# Patient Record
Sex: Female | Born: 1970 | Race: White | Hispanic: No | Marital: Married | Smoking: Never smoker
Health system: Southern US, Community
[De-identification: ages and names within clinical notes are randomized; demographics above are authoritative.]

## PROBLEM LIST (undated history)

## (undated) DIAGNOSIS — Z8051 Family history of malignant neoplasm of kidney: Secondary | ICD-10-CM

## (undated) DIAGNOSIS — Z8042 Family history of malignant neoplasm of prostate: Secondary | ICD-10-CM

## (undated) DIAGNOSIS — C50919 Malignant neoplasm of unspecified site of unspecified female breast: Secondary | ICD-10-CM

## (undated) HISTORY — DX: Family history of malignant neoplasm of prostate: Z80.42

## (undated) HISTORY — PX: MASTECTOMY: SHX3

## (undated) HISTORY — PX: REDUCTION MAMMAPLASTY: SUR839

## (undated) HISTORY — DX: Family history of malignant neoplasm of kidney: Z80.51

---

## 2015-06-16 ENCOUNTER — Other Ambulatory Visit: Payer: Self-pay | Admitting: Oncology

## 2015-08-23 ENCOUNTER — Other Ambulatory Visit: Payer: Self-pay | Admitting: Oncology

## 2015-08-24 ENCOUNTER — Encounter: Payer: Self-pay | Admitting: Oncology

## 2015-08-24 ENCOUNTER — Telehealth: Payer: Self-pay | Admitting: Oncology

## 2015-08-24 NOTE — Telephone Encounter (Signed)
Left a vm to let the patient that her appointment time has changed from 1pm to 430pm with Dr. Jana Hakim on 8/28. Will send patient a letter.

## 2015-09-03 ENCOUNTER — Other Ambulatory Visit: Payer: Self-pay | Admitting: *Deleted

## 2015-09-03 DIAGNOSIS — Z853 Personal history of malignant neoplasm of breast: Secondary | ICD-10-CM

## 2015-09-06 NOTE — Progress Notes (Signed)
[place]  Telephone:([phone] Fax:(336) [phone]  Patient rescheduled 09/07/2015 visit   ID: Erika Foster DOB: 1970/10/16  MR#: [id number]  BZJ#:[id number]  Patient Care Team: Chesley Noon, MD as PCP - General (Family Medicine) PCP: Chesley Noon, MD Chauncey Cruel, MD GYN: SU:  OTHER MD:  CHIEF COMPLAINT:   CURRENT TREATMENT:    BREAST CANCER HISTORY: According to the prior records. The patient was diagnosed with right-sided breast cancer at the age of 30, in 2006. She had a lumpectomy and sentinel lymph node sampling for what proved to be an invasive ductal carcinoma, grade 2, estrogen and progesterone receptor positive, HER-2 not amplified. She had a micrometastases in one of the 2 sentinel lymph nodes. 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 This showed a 0.6 cm residual tumor but all 10 additional lymph nodes were clear. Adjuvantly she was treated with doxorubicin and cyclophosphamide 4 followed by weekly paclitaxel 8, completed January 2007. She then received adjuvant radiation. After that she was on Zoladex and tamoxifen for 5 years.  Note that she had immediate implant reconstruction in 2006, but this had to be removed because of a staph infection. She then had a latissimus flap which unfortunately also was compromised by staph infection.  After moving to [facility] in 2014 the more recent data showing additional benefit from 10 years of tamoxifen compared to 5 was discussed and she resumed tamoxifen for a projected additional 5 years in 2014.  INTERVAL HISTORY: Her case was also presented in the multidisciplinary breast cancer conference that same morning. At that time a preliminary plan was proposed:  REVIEW OF SYSTEMS:  PAST MEDICAL HISTORY: No past medical history on file.  PAST SURGICAL HISTORY: No past surgical history on file.  FAMILY  HISTORY No family history on file. The patient's father was diagnosed with prostate cancer at age 59. The patient's mother had renal cancer at age 2. There is no history of breast or ovarian colon or uterine cancers.  GYNECOLOGIC HISTORY:  No LMP recorded. Menarche age 64, first live birth age 46, the patient is Brave P3. Menstrual cycles resumed after chemotherapy, April 2012.  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   ADVANCED DIRECTIVES:    HEALTH MAINTENANCE: Social History  Substance Use Topics  . Smoking status: Not on file  . Smokeless tobacco: Not on file  . Alcohol use Not on file     Colonoscopy:  PAP:  Bone density:   Allergies not on file  No current outpatient prescriptions on file.   No current facility-administered medications for this visit.     OBJECTIVE: There were no vitals filed for this visit.   There is no height or weight on file to calculate BMI.     Ocular: Sclerae unicteric, pupils equal, round and reactive to light Ear-nose-throat: Oropharynx clear and moist Lymphatic: No cervical or supraclavicular adenopathy Lungs no rales or rhonchi, good excursion bilaterally Heart regular rate and rhythm, no murmur appreciated Abd soft, nontender, positive bowel sounds MSK no focal spinal tenderness, no joint edema Neuro: non-focal, well-oriented, appropriate affect Breasts:    LAB RESULTS:  CMP  No results found for: NA, K, CL, CO2, GLUCOSE, BUN, CREATININE, CALCIUM, PROT, ALBUMIN, AST, ALT, ALKPHOS, BILITOT, GFRNONAA, GFRAA  INo results found for: SPEP, UPEP  No results found for: WBC, NEUTROABS, HGB, HCT, MCV, PLT    Chemistry   No results found  for: NA, K, CL, CO2, BUN, CREATININE, GLU No results found for: CALCIUM, ALKPHOS, AST, ALT, BILITOT     No results found for: LABCA2  No components found for: LABCA125  No results for input(s): INR in the last 168 hours.  Urinalysis No results found for: COLORURINE, APPEARANCEUR, LABSPEC, PHURINE,  GLUCOSEU, HGBUR, BILIRUBINUR, KETONESUR, PROTEINUR, UROBILINOGEN, NITRITE, LEUKOCYTESUR   STUDIES: No results found.  ELIGIBLE FOR AVAILABLE RESEARCH PROTOCOL:   ASSESSMENT: 45 y.o.   PLAN: We spent the better part of today's hour-long appointment discussing the biology of breast cancer in general, and the specifics of the patient's tumor in particular.    The patient has a good understanding of the overall plan. She agrees with it. She knows the goal of treatment in her case is cure. She will call with any problems that may develop before her next visit here.  Chauncey Cruel, MD   09/06/2015 4:27 PM Medical Oncology and Hematology [facility] [address] [place], [place] [address] Tel. [phone]    Fax. [phone]

## 2015-09-07 ENCOUNTER — Other Ambulatory Visit: Payer: BLUE CROSS/BLUE SHIELD

## 2015-09-07 ENCOUNTER — Ambulatory Visit: Payer: BLUE CROSS/BLUE SHIELD | Admitting: Oncology

## 2015-09-07 DIAGNOSIS — C50911 Malignant neoplasm of unspecified site of right female breast: Secondary | ICD-10-CM | POA: Insufficient documentation

## 2015-09-08 ENCOUNTER — Telehealth: Payer: Self-pay | Admitting: Oncology

## 2015-09-08 ENCOUNTER — Telehealth: Payer: Self-pay | Admitting: *Deleted

## 2015-09-08 NOTE — Telephone Encounter (Signed)
Tc to the patient regarding missed appt. Patient states that she did receive my letter regarding the time change and didn't change it on her calendar. She would prefer to have a morning appt. Ok with the appt being scheduled out in October. I will msg Dr. Jana Hakim to check next available date and time.

## 2015-09-08 NOTE — Telephone Encounter (Signed)
On 09/07/2015 at 43 this RN was contacted by check in stating pt was here for appointment scheduled at 74 today with MD.  MD presently with currently scheduled patients including 2 work ins - no availability for new patient to be seen at this time.  Request given to reschedule appointment ( noted as transfer pt from [facility] ).  This RN later reviewed above appointment and noted original appointment time given as 1230 for 8/28. Appointment changed - contact made by VM and noted letter mailed on 08/24/2015.  This RN sent urgent request for appointment to be rescheduled with recommendation for person to person contact due to miscommunication above.

## 2015-09-10 ENCOUNTER — Telehealth: Payer: Self-pay | Admitting: Oncology

## 2015-09-10 NOTE — Telephone Encounter (Signed)
Left vm to reschedule appt w/Magrinat

## 2015-09-11 ENCOUNTER — Telehealth: Payer: Self-pay | Admitting: Oncology

## 2015-09-11 ENCOUNTER — Encounter: Payer: Self-pay | Admitting: Oncology

## 2015-09-11 NOTE — Telephone Encounter (Signed)
Returned the pt's phone to make aware an appt had been rescheduled for her to see Dr. Jana Hakim on 10/6 at [facility]. Pt aware to arrive 30 minutes early. Letter mailed to the pt.

## 2015-10-16 ENCOUNTER — Other Ambulatory Visit (HOSPITAL_BASED_OUTPATIENT_CLINIC_OR_DEPARTMENT_OTHER): Payer: BLUE CROSS/BLUE SHIELD

## 2015-10-16 ENCOUNTER — Other Ambulatory Visit: Payer: Self-pay | Admitting: Oncology

## 2015-10-16 ENCOUNTER — Ambulatory Visit (HOSPITAL_BASED_OUTPATIENT_CLINIC_OR_DEPARTMENT_OTHER): Payer: BLUE CROSS/BLUE SHIELD | Admitting: Oncology

## 2015-10-16 ENCOUNTER — Telehealth: Payer: Self-pay | Admitting: Oncology

## 2015-10-16 VITALS — BP 114/71 | HR 70 | Temp 97.6°F | Resp 18 | Wt 151.7 lb

## 2015-10-16 DIAGNOSIS — Z853 Personal history of malignant neoplasm of breast: Secondary | ICD-10-CM

## 2015-10-16 DIAGNOSIS — M8580 Other specified disorders of bone density and structure, unspecified site: Secondary | ICD-10-CM

## 2015-10-16 DIAGNOSIS — Z1231 Encounter for screening mammogram for malignant neoplasm of breast: Secondary | ICD-10-CM

## 2015-10-16 DIAGNOSIS — Z9011 Acquired absence of right breast and nipple: Secondary | ICD-10-CM

## 2015-10-16 DIAGNOSIS — Z170 Estrogen receptor positive status [ER+]: Principal | ICD-10-CM

## 2015-10-16 DIAGNOSIS — C50911 Malignant neoplasm of unspecified site of right female breast: Secondary | ICD-10-CM

## 2015-10-16 LAB — CBC WITH DIFFERENTIAL/PLATELET
BASO%: 0.7 % (ref 0.0–2.0)
BASOS ABS: 0 10*3/uL (ref 0.0–0.1)
EOS%: 2.8 % (ref 0.0–7.0)
Eosinophils Absolute: 0.1 10*3/uL (ref 0.0–0.5)
HEMATOCRIT: 43 % (ref 34.8–46.6)
HGB: 14.3 g/dL (ref 11.6–15.9)
LYMPH%: 39.5 % (ref 14.0–49.7)
MCH: 30.8 pg (ref 25.1–34.0)
MCHC: 33.3 g/dL (ref 31.5–36.0)
MCV: 92.5 fL (ref 79.5–101.0)
MONO#: 0.3 10*3/uL (ref 0.1–0.9)
MONO%: 6.8 % (ref 0.0–14.0)
NEUT#: 2.3 10*3/uL (ref 1.5–6.5)
NEUT%: 50.2 % (ref 38.4–76.8)
Platelets: 322 10*3/uL (ref 145–400)
RBC: 4.65 10*6/uL (ref 3.70–5.45)
RDW: 12.7 % (ref 11.2–14.5)
WBC: 4.5 10*3/uL (ref 3.9–10.3)
lymph#: 1.8 10*3/uL (ref 0.9–3.3)

## 2015-10-16 LAB — COMPREHENSIVE METABOLIC PANEL
ALK PHOS: 52 U/L (ref 40–150)
ALT: 14 U/L (ref 0–55)
ANION GAP: 8 meq/L (ref 3–11)
AST: 19 U/L (ref 5–34)
Albumin: 4 g/dL (ref 3.5–5.0)
BUN: 17.7 mg/dL (ref 7.0–26.0)
CO2: 26 mEq/L (ref 22–29)
Calcium: 9.5 mg/dL (ref 8.4–10.4)
Chloride: 106 mEq/L (ref 98–109)
Creatinine: 0.8 mg/dL (ref 0.6–1.1)
EGFR: 89 mL/min/{1.73_m2} — ABNORMAL LOW (ref >90)
Glucose: 104 mg/dl (ref 70–140)
Potassium: 4.5 mEq/L (ref 3.5–5.1)
Sodium: 140 mEq/L (ref 136–145)
TOTAL PROTEIN: 7.4 g/dL (ref 6.4–8.3)
Total Bilirubin: 0.42 mg/dL (ref 0.20–1.20)

## 2015-10-16 NOTE — Telephone Encounter (Signed)
04/13/2016 appointment rescheduled to 04/25/2016 per patient request.

## 2015-10-16 NOTE — Progress Notes (Signed)
[place]  Telephone:(336) [phone] Fax:(336) [phone]  Patient rescheduled 09/07/2015 visit   ID: Erika Foster DOB: [birth year]  MR#: [id number]  YIF#:[id number]  Patient Care Team: Chesley Noon, MD as PCP - General (Family Medicine) Dell Ponto, DO (Inactive) (Family Medicine) Danella Sensing, MD as Consulting Physician (Dermatology) Chauncey Cruel, MD as Consulting Physician (Oncology) Juanda Chance, NP as Nurse Practitioner (Obstetrics and Gynecology) PCP: Chesley Noon, MD Chauncey Cruel, MD OTHER MD:  CHIEF COMPLAINT: Estrogen receptor positive breast cancer  CURRENT TREATMENT: Observation   BREAST CANCER HISTORY: "Erika Foster" was diagnosed with right-sided breast cancer at the age of 64, in 2006. She had a lumpectomy and sentinel lymph node sampling October 2006 at the [facility] in [place] for what proved to be an invasive ductal carcinoma, grade 2, estrogen and progesterone receptor positive, HER-2 not amplified. She had a micrometastases in one of the 2 sentinel lymph nodes. 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 This showed a 0.6 cm residual tumor in the breast but all 10 additional lymph nodes were clear. Adjuvantly she was treated with doxorubicin and cyclophosphamide in dose dense fashion 4 followed by weekly paclitaxel 8, completed January 2007. She then received adjuvant radiation. After that she was on Zoladex and tamoxifen for 5 years.  She had immediate implant reconstruction in 2006, but this had to be removed because of a staph infection. She then had a latissimus flap which unfortunately also was compromised by staph infection.  After moving to [facility] in 2014 the more recent data showing additional benefit from 10 years of tamoxifen compared to 5 was discussed and she resumed tamoxifen for a projected additional 5 years in 2014. She went off  tamoxifen in 2016, at the 10 year anniversary of her surgery, on the advice of Dr. Lindon Romp at [facility]   Her subsequent history is as detailed below  INTERVAL HISTORY: Erika Foster was evaluated in the breast clinic 10/16/2015. She is establishing herself in my service today  REVIEW OF SYSTEMS: She follows a very regular exercise schedule, going to the gym and taking walks at least 5 days a week. She takes 2 a good diet. A detailed review of systems today was otherwise entirely benign  PAST MEDICAL HISTORY: No past medical history on file.  PAST SURGICAL HISTORY: No past surgical history on file.  FAMILY HISTORY No family history on file. The patient's father was diagnosed with prostate cancer at age 72. The patient's mother had renal cancer at age 101 and died shortly thereafter. The patient has one brother, 2 sisters, one of whom is an M.D. There is no history of breast or ovarian colon or uterine cancers in the family.  GYNECOLOGIC HISTORY:  No LMP recorded. Menarche age 48, first live birth age 5, the patient is GX P3. Menstrual cycles resumed after chemotherapy, and she is still having regular menstrual periods  SOCIAL HISTORY:  Erika Foster works for [employer] as does her husband Erika Foster. There are 3 daughters are Erika Foster, 16, Erika Foster, Erika Foster, and Erika Foster, 4 (ages as of October 2016). The patient is a Tourist information centre manager.   ADVANCED DIRECTIVES: The patient's husband is her healthcare part of attorney   HEALTH MAINTENANCE: Social History  Substance Use Topics  . Smoking status: Not on file  . Smokeless tobacco: Not on file  . Alcohol use Not on file     Colonoscopy:  PAP:  Bone density:[facility], 04/01/2015, with a T score of -1.2 (osteopenia)   Not on File  No current outpatient prescriptions on file.   No current facility-administered medications for this visit.     OBJECTIVE: Young white woman in no acute distress Vitals:   10/16/15 0923  BP: 114/71  Pulse: 70  Resp: 18   Temp: 97.6 F (36.4 C)     There is no height or weight on file to calculate BMI.      Ocular: Sclerae unicteric, pupils equal, round and reactive to light Ear-nose-throat: Oropharynx clear and moist Lymphatic: No cervical or supraclavicular adenopathy Lungs no rales or rhonchi, good excursion bilaterally Heart regular rate and rhythm, no murmur appreciated Abd soft, nontender, positive bowel sounds MSK no focal spinal tenderness, no joint edema Neuro: non-focal, well-oriented, appropriate affect Breasts: The right breast is status post mastectomy with failed latissimus reconstruction. There is no evidence of local recurrence. The right axilla is benign. The left breast is unremarkable   LAB RESULTS:  CMP     Component Value Date/Time   NA 140 10/16/2015 0858   K 4.5 10/16/2015 0858   CO2 26 10/16/2015 0858   GLUCOSE 104 10/16/2015 0858   BUN 17.7 10/16/2015 0858   CREATININE 0.8 10/16/2015 0858   CALCIUM 9.5 10/16/2015 0858   PROT 7.4 10/16/2015 0858   ALBUMIN 4.0 10/16/2015 0858   AST 19 10/16/2015 0858   ALT 14 10/16/2015 0858   ALKPHOS 52 10/16/2015 0858   BILITOT 0.42 10/16/2015 0858    INo results found for: SPEP, UPEP  Lab Results  Component Value Date   WBC 4.5 10/16/2015   NEUTROABS 2.3 10/16/2015   HGB 14.3 10/16/2015   HCT 43.0 10/16/2015   MCV 92.5 10/16/2015   PLT 322 10/16/2015      Chemistry      Component Value Date/Time   NA 140 10/16/2015 0858   K 4.5 10/16/2015 0858   CO2 26 10/16/2015 0858   BUN 17.7 10/16/2015 0858   CREATININE 0.8 10/16/2015 0858      Component Value Date/Time   CALCIUM 9.5 10/16/2015 0858   ALKPHOS 52 10/16/2015 0858   AST 19 10/16/2015 0858   ALT 14 10/16/2015 0858   BILITOT 0.42 10/16/2015 0858       No results found for: LABCA2  No components found for: LABCA125  No results for input(s): INR in the last 168 hours.  Urinalysis No results found for: COLORURINE, APPEARANCEUR, LABSPEC, PHURINE, GLUCOSEU,  HGBUR, BILIRUBINUR, KETONESUR, PROTEINUR, UROBILINOGEN, NITRITE, LEUKOCYTESUR   STUDIES: Result Impression   No mammographic evidence of malignancy.  Breast composition: Scattered fibroglandular densities.  BI-RADS Category 1- Negative  RECOMMENDATIONS: Follow-up in 1 year.  [place] law now requires that mammography facilities inform their patients of their breast density and that higher density may increase their risk of cancer and may decrease sensitivity of mammography. Also, if heterogeneously dense or extremely dense, they should talk with their doctor about alternative screening strategies. The following website has been developed to assist patients and physicians: ncacr.org/breast-health.php. The patient lay summary letter includes their breast density, a link to the density website and for patients with dense breasts, a recommendation to talk with their doctor.  Result Narrative  MAMMOGRAM LEFT BREAST, 12/02/2014 8:48 AM  INDICATION:BREAST CANCERC50.911 Breast cancer, right ([place])   COMPARISON: Multiple priors.  TECHNIQUE: CC and MLO views were performed of the left breast with digital technique and computer-aided detection. The examination was supplemented with 3-D tomosynthesis.  FINDINGS:   No suspicious mass or calcifications are seen in the left breast. The patient is status post right mastectomy.  Status Results Details       ELIGIBLE FOR AVAILABLE RESEARCH PROTOCOL: No  ASSESSMENT: 45 y.o. BRCA negative Erika Foster [place] woman  (1) status post right lumpectomy and sentinel lymph node sampling in 2006 (age 19) for a pT1 pN1 (mic), stage IIA invasive ductal carcinoma, grade 2, estrogen and progesterone receptor positive, HER-2 not amplified, with positive margins  (2) status post right mastectomy and full axillary lymph node dissection 2006 showing an additional 0.6 cm of tumor, but clear margins and 0 of 10 axillary lymph nodes involved  (a)  status post right breast reconstruction 2, including a latissimus flap, both failed secondary to staph infections   (3) treated adjuvantly with doxorubicin and cyclophosphamide in dose dense fashion 4 followed by paclitaxel 8  (4) completed adjuvant radiation spring of 2007  (5) received goserelin and tamoxifen for 5 years, (through 2012)  (6) resumed tamoxifen 2014, discontinuing it in 2016, for a total of 8 years of anti-estrogen therapy  PLAN: I spent approximately 45 minutes with a [place] going over her situation. She understands she had a small breast cancer which was not very aggressive looking (grade 2). There was a microscopic cancer deposit in 1 of 12 axillary lymph nodes. With local treatment only--surgery and radiation--she would have had a generally good prognosis and I would have quoted her based on that a 24% risk of recurrence (with local treatment only).  Chemotherapy in general lowers risk by about one third so that would have brought her risk down to about 16%. She has taken anti-estrogens for a total of 8 years. I would say that would just about cut her risk in half so that brings her down to about 8%. In addition however she received Zoladex and we know that in women under 35 this further reduces the risk of recurrence above and beyond treatment with tamoxifen. Accordingly, and particularly taking into account that she is now 11 years out from surgery with no evidence of recurrence, her risk at this point will be well under 5%  It is not clear that that risk would decrease significantly if she took 2 additional years of tamoxifen or if she went on aromatase inhibitors for 2 years when she becomes postmenopausal. It is also not clear whether she would get any further risk reduction by taking raloxifene for her mild osteopenia, since she has already had 8 years of a similar estrogen receptor modulator. Accordingly we are not pursuing any of those options.  Two possibilities she  could consider are (1) going on bisphosphonates for osteopenia. In postmenopausal women there is evidence that this can reduce the risk of breast cancer. However review of her bone density this year (we obtained a copy after the visit) shows very mild osteopenia, not warranting further intervention then calcium and vitamin D supplementation and exercise.   The other approach (2) is one she is already taking, namely a good diet and I good exercise program. This is not breast cancer specific but in general this could reduce the risk of a new cancer developing. I encouraged her to continue with what she is doing, which appears excellent to me.  I would get her genetics updated and I have requested a meeting with our genetics counselors to get that accomplished. She is also going to be due for repeat mammography next month and I have put in  the appropriate orders for that.  We discussed her "graduating" from follow-up, but she tells me she would like to see me at least one more time and I have made her a return appointment with me in 6 months. At that point she may wish to transition to our survivorship program.  Erika Foster has a good understanding of the overall plan. She agrees with it. She will call with any problems that may develop before her next visit here.  Chauncey Cruel, MD   10/17/2015 2:28 PM Medical Oncology and Hematology [facility] [address] [place], [place] [address] Tel. [phone]    Fax. [phone]

## 2015-11-23 ENCOUNTER — Other Ambulatory Visit: Payer: BLUE CROSS/BLUE SHIELD

## 2015-11-23 ENCOUNTER — Telehealth: Payer: Self-pay | Admitting: Genetic Counselor

## 2015-11-23 ENCOUNTER — Encounter: Payer: BLUE CROSS/BLUE SHIELD | Admitting: Genetic Counselor

## 2015-11-23 ENCOUNTER — Encounter: Payer: Self-pay | Admitting: Genetic Counselor

## 2015-11-23 DIAGNOSIS — Z1379 Encounter for other screening for genetic and chromosomal anomalies: Secondary | ICD-10-CM | POA: Insufficient documentation

## 2015-11-23 NOTE — Telephone Encounter (Signed)
Pt called in to reschedule her appt due to a work conflict.

## 2015-12-09 ENCOUNTER — Ambulatory Visit: Payer: BLUE CROSS/BLUE SHIELD

## 2015-12-18 ENCOUNTER — Ambulatory Visit
Admission: RE | Admit: 2015-12-18 | Discharge: 2015-12-18 | Disposition: A | Discharge status: Home / Self Care | Payer: BLUE CROSS/BLUE SHIELD | Source: Ambulatory Visit | Attending: Oncology | Admitting: Oncology

## 2015-12-18 DIAGNOSIS — Z1231 Encounter for screening mammogram for malignant neoplasm of breast: Secondary | ICD-10-CM

## 2015-12-18 DIAGNOSIS — Z9011 Acquired absence of right breast and nipple: Secondary | ICD-10-CM

## 2015-12-30 ENCOUNTER — Ambulatory Visit (HOSPITAL_BASED_OUTPATIENT_CLINIC_OR_DEPARTMENT_OTHER): Payer: BLUE CROSS/BLUE SHIELD | Admitting: Genetic Counselor

## 2015-12-30 ENCOUNTER — Other Ambulatory Visit: Payer: BLUE CROSS/BLUE SHIELD

## 2015-12-30 ENCOUNTER — Encounter: Payer: Self-pay | Admitting: Genetic Counselor

## 2015-12-30 DIAGNOSIS — Z315 Encounter for genetic counseling: Secondary | ICD-10-CM

## 2015-12-30 DIAGNOSIS — Z8051 Family history of malignant neoplasm of kidney: Secondary | ICD-10-CM

## 2015-12-30 DIAGNOSIS — Z170 Estrogen receptor positive status [ER+]: Principal | ICD-10-CM

## 2015-12-30 DIAGNOSIS — Z8042 Family history of malignant neoplasm of prostate: Secondary | ICD-10-CM | POA: Diagnosis not present

## 2015-12-30 DIAGNOSIS — C50911 Malignant neoplasm of unspecified site of right female breast: Secondary | ICD-10-CM | POA: Diagnosis not present

## 2015-12-30 NOTE — Progress Notes (Signed)
REFERRING PROVIDER: Chesley Noon, MD [address] [place], [place] [address]  PRIMARY PROVIDER:  Chesley Noon, MD  PRIMARY REASON FOR VISIT:  1. Malignant neoplasm of right breast in female, estrogen receptor positive, unspecified site of breast ([place])   2. Family history of prostate cancer   3. Family history of kidney cancer      HISTORY OF PRESENT ILLNESS:   Ms. Bonnes, a 45 y.o. female, was seen for a [facility] cancer genetics consultation at the request of Dr. Jana Hakim due to a personal and family history of cancer.  Ms. Lusk presents to clinic today to discuss the possibility of a hereditary predisposition to cancer, genetic testing, and to further clarify her future cancer risks, as well as potential cancer risks for family members.   In 2006, at the age of 22, Ms. Mayon was diagnosed with invasive ductal carcinoma of the right breast. This was treated with right sided mastectomy, chemotherapy, radiation and tamoxifen.  Ms. Oyster reports having genetic testing at the time of her diagnosis and reports that it was negative.    CANCER HISTORY:   No history exists.     HORMONAL RISK FACTORS:  Menarche was at age 67.  First live birth at age 58.  OCP use for approximately 10-12 years.  Ovaries intact: yes.  Hysterectomy: no.  Menopausal status: premenopausal.  HRT use: 0 years. Colonoscopy: no; not examined. Mammogram within the last year: yes. Number of breast biopsies: 1. Up to date with pelvic exams:  yes. Any excessive radiation exposure in the past:  Just for cancer treatment  Past Medical History:  Diagnosis Date  . Family history of kidney cancer   . Family history of prostate cancer     History reviewed. No pertinent surgical history.  Social History   Social History  . Marital status: Married    Spouse name: N/A  . Number of children: N/A  . Years of education: N/A   Social History Main Topics  . Smoking status: Never Smoker   . Smokeless tobacco: Never Used  . Alcohol use Yes     Comment: rare  . Drug use: No  . Sexual activity: Not Asked   Other Topics Concern  . None   Social History Narrative  . None     FAMILY HISTORY:  We obtained a detailed, 4-generation family history.  Significant diagnoses are listed below: Family History  Problem Relation Age of Onset  . Kidney cancer Mother 50  . Prostate cancer Father 42  . Leukemia Maternal Uncle     died in his 19s  . Cancer Maternal Grandfather     unknown form  . Lung cancer Paternal Grandfather     smoker and a Ecologist    The patient has three daughters who are cancer free.  She has two sisters and a brother who are all cancer free.  Her mother was diagnosed with renal cell carcinoma at 64 and died at 26.  She had a sister and brother.  The brother was diagnosed with a rare form of leukemia and died.  The patient's maternal grandmother died of natural causes at 33 and her grandfather died of an unknown cancer.  The patient's father was diagnosed with prostate cancer at 64.  He had two brothers, one who died around 35 from an unknown, non cancer related, reason.  Her paternal grandmother is alive at 67 but her grandfather died of lung cancer, most likely related to being a  Ecologist and a smoker.  Ms. Loja is unaware of previous family history of genetic testing for hereditary cancer risks in other family members other than herself. Patient's maternal ancestors are of [place], [place] and [place] descent, and paternal ancestors are of [place] and [place] descent. There is no reported Ashkenazi Jewish ancestry. There is no known consanguinity.  GENETIC COUNSELING ASSESSMENT: Adin Lariccia is a 45 y.o. female with a personal and family history of cancer which is somewhat suggestive of a hereditary cancer syndrome and predisposition to cancer. We, therefore, discussed and recommended the following at today's visit.   DISCUSSION: We discussed  that about 5-10% of breast cancer is hereditary with most cases due to BRCA mutations.  We discussed that her previously negative testing would have identified most BRCA mutations, but since her original testing we have added del/dup testing and have identified Alu sequences that can contribute to hereditary causes of cancer.  We also discussed the limited family history of her father's side in that he only had brothers and her cousins are all female.  Other genes associated with hereditary breast cancer include ATM, CHEK2 and PALB2.  We reviewed the characteristics, features and inheritance patterns of hereditary cancer syndromes. We also discussed genetic testing, including the appropriate family members to test, the process of testing, insurance coverage and turn-around-time for results. We discussed the implications of a negative, positive and/or variant of uncertain significant result. We recommended Ms. Fillingim pursue genetic testing for the Comprehensive Common Cancer gene panel.   We discussed some emotional issues with genetic testing.  Ms. Eisenberger relates how she feels that the weight of genetic testing falls on her shoulders.  She reports that she remembers exactly where she was when she was called with her results 11 years ago and how relieved she was.  Now she is reopening this "can of worms".  I reassured her that this feeling is normal, and that if she does test positive, it is certainly not her fault.  In the worst case scenario in that she does test positive, it would allow herself, as well as family members know how to best screen for whatever they are at greatest risk for.  Based on Ms. Burnham's personal and family history of cancer, she meets medical criteria for genetic testing. Despite that she meets criteria, she may still have an out of pocket cost. We discussed that if her out of pocket cost for testing is over $100, the laboratory will call and confirm whether she wants to proceed  with testing.  If the out of pocket cost of testing is less than $100 she will be billed by the genetic testing laboratory.   PLAN: After considering the risks, benefits, and limitations, Ms. Sheer  provided informed consent to pursue genetic testing and the blood sample was sent to [facility] for analysis of the Comprehensive Common Cancer panel. Results should be available within approximately 2-3 weeks' time, at which point they will be disclosed by telephone to Ms. Johal, as will any additional recommendations warranted by these results. Ms. Stanard will receive a summary of her genetic counseling visit and a copy of her results once available. This information will also be available in Epic. We encouraged Ms. Golliday to remain in contact with cancer genetics annually so that we can continuously update the family history and inform her of any changes in cancer genetics and testing that may be of benefit for her family. Ms. Divelbiss questions were answered to her satisfaction  today. Our contact information was provided should additional questions or concerns arise.  Lastly, we encouraged Ms. Anguiano to remain in contact with cancer genetics annually so that we can continuously update the family history and inform her of any changes in cancer genetics and testing that may be of benefit for this family.   Ms.  Mazurek questions were answered to her satisfaction today. Our contact information was provided should additional questions or concerns arise. Thank you for the referral and allowing [place] to share in the care of your patient.   Candice Tobey P. Florene Glen, [place], [facility] Certified Genetic Counselor [email or web address] phone: [phone]  The patient was seen for a total of 45 minutes in face-to-face genetic counseling.  This patient was discussed with Drs. Magrinat, Lindi Adie and/or Burr Medico who agrees with the above.     _______________________________________________________________________ For Office Staff:  Number of people involved in session: 1 Was an Intern/ student involved with case: no

## 2016-01-19 ENCOUNTER — Telehealth: Payer: Self-pay | Admitting: Genetic Counselor

## 2016-01-19 NOTE — Telephone Encounter (Signed)
Revealed positive CHEK2 result.  Explained that this is a moderate risk gene for breast cancer, and in part explains her diagnosis.  She will come in tomorrow to discuss this test result.

## 2016-01-19 NOTE — Telephone Encounter (Signed)
LM on VM that results are back and that we would like to go through them with her.  Left CB number.

## 2016-01-20 ENCOUNTER — Ambulatory Visit (HOSPITAL_BASED_OUTPATIENT_CLINIC_OR_DEPARTMENT_OTHER): Payer: BLUE CROSS/BLUE SHIELD | Admitting: Genetic Counselor

## 2016-01-20 ENCOUNTER — Encounter: Payer: BLUE CROSS/BLUE SHIELD | Admitting: Genetic Counselor

## 2016-01-20 DIAGNOSIS — Z803 Family history of malignant neoplasm of breast: Secondary | ICD-10-CM | POA: Diagnosis not present

## 2016-01-20 DIAGNOSIS — Z809 Family history of malignant neoplasm, unspecified: Secondary | ICD-10-CM

## 2016-01-20 DIAGNOSIS — Z315 Encounter for genetic counseling: Secondary | ICD-10-CM

## 2016-01-20 DIAGNOSIS — Z8042 Family history of malignant neoplasm of prostate: Secondary | ICD-10-CM

## 2016-01-20 DIAGNOSIS — Z853 Personal history of malignant neoplasm of breast: Secondary | ICD-10-CM | POA: Diagnosis not present

## 2016-01-20 DIAGNOSIS — Z806 Family history of leukemia: Secondary | ICD-10-CM

## 2016-01-20 DIAGNOSIS — Z801 Family history of malignant neoplasm of trachea, bronchus and lung: Secondary | ICD-10-CM

## 2016-01-20 DIAGNOSIS — Z1379 Encounter for other screening for genetic and chromosomal anomalies: Secondary | ICD-10-CM

## 2016-01-20 DIAGNOSIS — C50911 Malignant neoplasm of unspecified site of right female breast: Secondary | ICD-10-CM

## 2016-01-20 DIAGNOSIS — Z8051 Family history of malignant neoplasm of kidney: Secondary | ICD-10-CM

## 2016-01-20 DIAGNOSIS — Z170 Estrogen receptor positive status [ER+]: Principal | ICD-10-CM

## 2016-01-20 NOTE — Progress Notes (Signed)
GENETIC TEST RESULTS   Patient Name: Erika Foster Patient Age: 46 y.o. Encounter Date: 01/20/2016  Referring Provider: Lurline Del, MD    Erika Foster was seen in the [facility] clinic on January 20, 2016 due to a personal and family history of cancer and concern regarding a hereditary predisposition to cancer in the family. Please refer to the prior Genetics clinic note for more information regarding Erika Foster's medical and family histories and our assessment at the time.   FAMILY HISTORY:  We obtained a detailed, 4-generation family history.  Significant diagnoses are listed below: Family History  Problem Relation Age of Onset  . Kidney cancer Mother 80  . Prostate cancer Father 88  . Leukemia Maternal Uncle     died in his 32s  . Cancer Maternal Grandfather     unknown form  . Lung cancer Paternal Grandfather     smoker and a Ecologist    The patient has three daughters who are cancer free.  She has two sisters and a brother who are all cancer free.  Her mother was diagnosed with renal cell carcinoma at 91 and died at 48.  She had a sister and brother.  The brother was diagnosed with a rare form of leukemia and died.  The patient's maternal grandmother died of natural causes at 70 and her grandfather died of an unknown cancer.  The patient's father was diagnosed with prostate cancer at 62.  He had two brothers, one who died around 74 from an unknown, non cancer related, reason.  Her paternal grandmother is alive at 70 but her grandfather died of lung cancer, most likely related to being a Ecologist and a smoker.  Erika Foster is unaware of previous family history of genetic testing for hereditary cancer risks in other family members other than herself. Patient's maternal ancestors are of [place], [place] and [place] descent, and paternal ancestors are of [place] and [place] descent. There is no reported Ashkenazi Jewish ancestry. There is no known  consanguinity.  GENETIC TESTING:  At the time of Erika Foster visit, we recommended she pursue genetic testing of the Comprehensive Cancer panel. The genetic testing, reported out on January 15, 2016 through the Comprehensive Cancer Panel offered by [facility] identified a single, heterozygous pathogenic gene mutation called CHEK2, c.[id number]. There were no deleterious mutations in APC, ATM, AXIN2, BAP1, BARD1, BMPR1A, BRCA1, BRCA2, BRIP1, CDH1, CDK4, CDKN2A, CHEK2, EPCAM, FANCC, FH, FLCN, HOXB13, MET, MITF, MLH1, MSH2, MSH6, MUTYH, NBN, NF1, NTHL1, PALB2, PMS2, POLD1, POLE, POT1, PTEN, RAD51C, RAD51D, RECQL, SCG5/GREM1, SDHB, SDHC, SDHD, SMAD4, STK11, TP53, TSC!, TSC2 and VHL.Marland Kitchen  DISCUSSION: CHEK2 mutations have been found to be associated with an increased risk of breast and other cancers. The estimated cancer risks vary widely and may be influenced by family history. Women with a CHEK2 deleterious mutation have approximately a 24% (no family history of breast cancer) to 48% (strong family history of breast cancer) lifetime risk of breast cancer and up to a 25% risk of a second breast cancer. Men may have an increased risk for female breast cancer of about 1%. Men and women may have an increased risk of colon cancer (~10% lifetime risk). According to the NCCN guidelines, individuals with CHEK2 mutations should consider breast MRI's as a part of regular breast cancer screening, and depending on family history could consider a risk-reducing mastectomy.  Breast cancer screening should begin, for women, at age 45 or 77 years younger than the earliest age of onset.  Colon cancer screening should begin at age 69 and continue every 5 years or based on polyp number.    Genetic testing did identify a variant called, BRCA2 [id number]>T.  As we discussed, it is unknown if this variant is associated with increased risk for cancer, or if it is a normal finding. With time, the significance will likely be determined and we will  review any new information regarding this variant at that time.  CANCER SCREENING: Below are the NCCN Practice guidelines for women and men.  However, because the breast cancer risks for women and prostate cancer risks for men may be similar, it is appropriate to consider these high risk management recommendations.  Breast Management Options We reviewed the NCCN practice guidelines (v1.2018) for breast management for women at an increased risk of breast cancer because of CHEK2 mutations:   1. Breast awareness (which may include periodic, consistent breast self exam) starting at age 41.  2. Breast screening:  . Starting at age 89, annual mammogram and breast MRI screening, or starting 10 years younger than the earliest age of onset.   Colon Cancer Management:  Men and women with a deleterious CHEK2 mutation may have up to a 10% lifetime risk for colon cancer. The following is recommended for individuals with a CHEK2 mutation:  Personal history of colon cancer  Follow instructions provided by your physician based on your personal history.  Do not have a personal history of colon cancer but have a parent/sibling/child with colon cancer: Colonoscopy every 5 years starting at age 41 or 18 years younger than the earliest age of onset, whichever is younger.  Do not have a personal history of colon cancer but do not have a parent/sibling/child with colon cancer: Colonoscopy every 5 years starting at age 86.   FAMILY MEMBERS: It is important that all of Erika Foster relatives (both men and women) know of the presence of this gene mutation.  Women need to know that they may be at increased risk for breast and colon cancers.  Men are at slightly increased risk for breast, prostate and colon cancers.  Genetic testing can sort out who in your family is at risk and who is not.  We would be happy to help meet with and coordinate genetic testing for any relative that is interested.  Erika Foster  children are at 50% risk to have inherited the mutation found in her. Erika Foster children, however, are relatively young and this will not be of any consequence to them for several years. We do not test children because there is no risk to them until they are adults.  It should also be kept in mind that for children, we are bound to know a great deal more about breast cancer and its prevention in several years' time. We recommend that Ms. Schriefer's children have genetic counseling and testing by the time that they are in their early 20's.    Ms. Brunke siblings are at 50% risk to have inherited the mutation found in her. We recommend they have genetic testing for this same mutation, as identifying the presence of this mutation would allow them to also take advantage of risk-reducing measures.   Our knowledge of cancer risks related to CHEK2 mutations will continue to evolve. We recommended that Ms. Vandenberg follow up with the genetics clinic annually so we can provide her with the most current information about CHEK2 and cancer risk, as well as with any changes to her family history (new cancer  diagnoses, genetic test results).  SUPPORT AND RESOURCES:  We provided information about two support groups for hereditary cancer syndrome information and support, Facing Our Risk (www.facingourrisk.com) and Bright Pink (www.brightpink.org) which some people have found useful.  They provide opportunities to speak with other individuals from high-risk families.    Our contact number was provided. Ms. Tomey questions were answered to her satisfaction, and she knows she is welcome to call us at anytime with additional questions or concerns.   Roma Kayser, MS, [facility] Certified Genetic Counselor [email or web address]

## 2016-02-01 ENCOUNTER — Other Ambulatory Visit: Payer: Self-pay | Admitting: Oncology

## 2016-02-01 NOTE — Progress Notes (Unsigned)
I called [name] and told her I was aware of her check to mutation. I suggested she come to see me general 26 at 2 PM to discuss results.

## 2016-02-05 ENCOUNTER — Ambulatory Visit (HOSPITAL_BASED_OUTPATIENT_CLINIC_OR_DEPARTMENT_OTHER): Payer: BLUE CROSS/BLUE SHIELD | Admitting: Oncology

## 2016-02-05 DIAGNOSIS — Z853 Personal history of malignant neoplasm of breast: Secondary | ICD-10-CM

## 2016-02-05 DIAGNOSIS — Z1509 Genetic susceptibility to other malignant neoplasm: Secondary | ICD-10-CM

## 2016-02-05 DIAGNOSIS — Z170 Estrogen receptor positive status [ER+]: Principal | ICD-10-CM

## 2016-02-05 DIAGNOSIS — C50811 Malignant neoplasm of overlapping sites of right female breast: Secondary | ICD-10-CM

## 2016-02-05 DIAGNOSIS — Z1502 Genetic susceptibility to malignant neoplasm of ovary: Secondary | ICD-10-CM

## 2016-02-05 DIAGNOSIS — Z1589 Genetic susceptibility to other disease: Secondary | ICD-10-CM

## 2016-02-05 DIAGNOSIS — C50919 Malignant neoplasm of unspecified site of unspecified female breast: Secondary | ICD-10-CM

## 2016-02-05 NOTE — Progress Notes (Signed)
[place]  Telephone:(336) [phone] Fax:(336) [phone]  Patient rescheduled 09/07/2015 visit   ID: Erika Foster DOB: Apr 06, 1970  MR#: [id number]  QVZ#:[id number]  Patient Care Team: Chesley Noon, MD as PCP - General (Family Medicine) Dell Ponto, DO (Inactive) (Family Medicine) Danella Sensing, MD as Consulting Physician (Dermatology) Chauncey Cruel, MD as Consulting Physician (Oncology) Juanda Chance, NP as Nurse Practitioner (Obstetrics and Gynecology) PCP: Chesley Noon, MD Chauncey Cruel, MD OTHER MD:  CHIEF COMPLAINT: Estrogen receptor positive breast cancer  CURRENT TREATMENT: Intensified screening   BREAST CANCER HISTORY: From the earlier consult note:  "Erika Foster" was diagnosed with right-sided breast cancer at the age of 33, in 2006. She had a lumpectomy and sentinel lymph node sampling October 2006 at the [facility] in [place] for what proved to be an invasive ductal carcinoma, grade 2, estrogen and progesterone receptor positive, HER-2 not amplified. She had a micrometastases in one of the 2 sentinel lymph nodes. 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 This showed a 0.6 cm residual tumor in the breast but all 10 additional lymph nodes were clear. Adjuvantly she was treated with doxorubicin and cyclophosphamide in dose dense fashion 4 followed by weekly paclitaxel 8, completed January 2007. She then received adjuvant radiation. After that she was on Zoladex and tamoxifen for 5 years.  She had immediate implant reconstruction in 2006, but this had to be removed because of a staph infection. She then had a latissimus flap which unfortunately also was compromised by staph infection.  After moving to [facility] in 2014 the more recent data showing additional benefit from 10 years of tamoxifen compared to 5 was discussed and she resumed tamoxifen for a  projected additional 5 years in 2014. She went off tamoxifen in 2016, at the 10 year anniversary of her surgery, on the advice of Dr. 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 This significantly increases her risk of breast cancer, and also of colorectal carcinoma. She is here to discuss the implications of that.  REVIEW OF SYSTEMS: Erika Foster remains very active, going to the gym several times a week, and taking long walks. Her only current problem is in mild sore throat. A detailed review of systems today was otherwise negative.   PAST MEDICAL HISTORY: Past Medical History:  Diagnosis Date  . Family history of kidney cancer   . Family history of prostate cancer     PAST SURGICAL HISTORY: No past surgical history on file.  FAMILY HISTORY Family History  Problem Relation Age of Onset  . Kidney cancer Mother 81  . Prostate cancer Father 9  . Leukemia Maternal Uncle     died in his 46s  . Cancer Maternal Grandfather     unknown form  . Lung cancer Paternal Grandfather     smoker and a Ecologist   The patient's father was diagnosed with prostate cancer at age 70. The patient's mother had renal cancer at age 28 and died shortly thereafter. The patient has one brother, 2 sisters, one of whom is an M.D. There is no history of breast or ovarian, colon or uterine cancers in the family.  GYNECOLOGIC HISTORY:  No LMP recorded. Menarche age 64, first live birth age 9, the patient is GX P3. Menstrual cycles resumed after chemotherapy, and she is still having regular menstrual periods  SOCIAL HISTORY:  Erika Foster works for [employer] as does her husband Shanon Brow. There are 3 daughters are [place], 16, Lauren, [place], and lane, 4 (ages as of  October 2016). The patient is a Tourist information centre manager.   ADVANCED DIRECTIVES: The patient's husband is her healthcare part of attorney   HEALTH MAINTENANCE: Social History  Substance Use Topics  . Smoking status: Never Smoker  . Smokeless tobacco: Never Used  . Alcohol use Yes     Comment: rare     Colonoscopy: Pending  PAP:  Bone density:[facility], 04/01/2015, with a T score of -1.2 (osteopenia)   Not on File  No current outpatient prescriptions on file.   No current facility-administered medications for this visit.     OBJECTIVE: Young white womanWho appears well  Vitals:   02/05/16 1416  BP: 124/75  Pulse: 80  Resp: 17  Temp: 98.2 F (36.8 C)     Body mass index is 25.91 kg/m.      Sclerae unicteric, pupils round and equal Oropharynx clear and moist-- no thrush or other lesions No cervical or supraclavicular adenopathy Lungs no rales or rhonchi Heart regular rate and rhythm Abd soft, nontender, positive bowel sounds MSK no focal spinal tenderness, no upper extremity lymphedema Neuro: nonfocal, well oriented, appropriate affect Breasts: The right breast is status post mastectomy. It has undergone latissimus reconstruction. It was no evidence of local recurrence. The left breast is unremarkable. Both axillae are benign.  LAB RESULTS:  CMP     Component Value Date/Time   NA 140 10/16/2015 0858   K 4.5 10/16/2015 0858   CO2 26 10/16/2015 0858   GLUCOSE 104 10/16/2015 0858   BUN 17.7 10/16/2015 0858   CREATININE 0.8 10/16/2015 0858   CALCIUM 9.5 10/16/2015 0858   PROT 7.4 10/16/2015 0858   ALBUMIN 4.0 10/16/2015 0858   AST 19 10/16/2015 0858   ALT 14 10/16/2015 0858   ALKPHOS 52 10/16/2015 0858   BILITOT 0.42 10/16/2015 0858    INo results found for: SPEP, UPEP  Lab Results  Component Value Date   WBC 4.5 10/16/2015   NEUTROABS 2.3 10/16/2015   HGB 14.3 10/16/2015   HCT 43.0 10/16/2015   MCV 92.5 10/16/2015   PLT 322 10/16/2015       Chemistry      Component Value Date/Time   NA 140 10/16/2015 0858   K 4.5 10/16/2015 0858   CO2 26 10/16/2015 0858   BUN 17.7 10/16/2015 0858   CREATININE 0.8 10/16/2015 0858      Component Value Date/Time   CALCIUM 9.5 10/16/2015 0858   ALKPHOS 52 10/16/2015 0858   AST 19 10/16/2015 0858   ALT 14 10/16/2015 0858   BILITOT 0.42 10/16/2015 0858       No results found for: LABCA2  No components found for: LABCA125  No results for input(s): INR in the last 168 hours.  Urinalysis No results found for: COLORURINE, APPEARANCEUR, LABSPEC, PHURINE, GLUCOSEU, HGBUR, BILIRUBINUR, KETONESUR, PROTEINUR, UROBILINOGEN, NITRITE, LEUKOCYTESUR   STUDIES: No results found.         ELIGIBLE FOR AVAILABLE RESEARCH PROTOCOL: No  ASSESSMENT: 46 y.o. CHEK2 positive [place] woman  (1) status post right lumpectomy and sentinel lymph node sampling in 2006 (age 71) for a pT1 pN1 (mic),  stage IIA invasive ductal carcinoma, grade 2, estrogen and progesterone receptor positive, HER-2 not amplified, with positive margins  (2) status post right mastectomy and full axillary lymph node dissection 2006 showing an additional 0.6 cm of tumor, but clear margins and 0 of 10 axillary lymph nodes involved  (a) status post right breast reconstruction 2, including a latissimus flap, both failed secondary to staph infections   (3) treated adjuvantly with doxorubicin and cyclophosphamide in dose dense fashion 4 followed by paclitaxel 8  (4) completed adjuvant radiation spring of 2007  (5) received goserelin and tamoxifen for 5 years, (through 2012)  (6) resumed tamoxifen 2014, discontinuing it in 2016, for a total of 8 years of anti-estrogen therapy  (7) genetics testing 01/15/2016 through the Comprehensive Cancer Panel offered by [facility] identified a single, heterozygous pathogenic gene mutation called CHEK2, c.1100delC. There were no deleterious mutations in APC, ATM, AXIN2, BAP1,  BARD1, BMPR1A, BRCA1, BRCA2, BRIP1, CDH1, CDK4, CDKN2A, CHEK2, EPCAM, FANCC, FH, FLCN, HOXB13, MET, MITF, MLH1, MSH2, MSH6, MUTYH, NBN, NF1, NTHL1, PALB2, PMS2, POLD1, POLE, POT1, PTEN, RAD51C, RAD51D, RECQL, SCG5/GREM1, SDHB, SDHC, SDHD, SMAD4, STK11, TP53, TSC!, TSC2 and VHL..  (a) will need yearly MRI of the breast in addition to yearly mammography  (b) will need to intensified colonoscopic screening   PLAN: I spent approximately 30 minutes with Erika Foster, most of it in counseling regarding what to do about her newly diagnosed genetics mutation. She has discussed it with her siblings and even though her  Brothers have not chosen to be tested I strongly suggested they let their physicians no that they may be carrying a check to mutation because that will affect the risk of prostate cancer. Recall Ubaldo Glassing father did have prostate cancerOf course it may also be something they could pass on 2 daughters and a really opted be tested for that reason.  Ubaldo Glassing daughters should be tested in their early 40s and by then of course we will be testing many more genes and it will be less expensive to test.  Erika Foster herself and needs to have a screening colonoscopy and I'm referring her to Dr. Collene Mares regarding that.  We again discussed risk reduction strategies. She has already had 8 years of anti-estrogens and I am not sure any further antiestrogen therapy would be useful in terms of risk reduction. She also has had 1 breast removed and that also cut back on the risk. She is considering removing the other breast and having bilateral re-reconstruction since she is not satisfied with her to his original plastic surgery but at this point she does not want to proceed with further surgery.  When she does want to do is to intensify screening. She will have a breast MRI in April, and we will try to move up her mammography to October. If we can keep her imaging apart every 6 months that would be optimal   Chauncey Cruel, MD    02/05/2016 2:57 PM Medical Oncology and Hematology [facility] [place], [place] [address] Tel. [phone]    Fax. [phone]

## 2016-02-07 ENCOUNTER — Encounter: Payer: Self-pay | Admitting: Oncology

## 2016-02-07 DIAGNOSIS — Z1589 Genetic susceptibility to other disease: Secondary | ICD-10-CM

## 2016-02-07 DIAGNOSIS — C50811 Malignant neoplasm of overlapping sites of right female breast: Secondary | ICD-10-CM | POA: Insufficient documentation

## 2016-02-07 DIAGNOSIS — C50919 Malignant neoplasm of unspecified site of unspecified female breast: Secondary | ICD-10-CM | POA: Insufficient documentation

## 2016-02-07 DIAGNOSIS — Z1509 Genetic susceptibility to other malignant neoplasm: Secondary | ICD-10-CM

## 2016-02-07 DIAGNOSIS — Z1502 Genetic susceptibility to malignant neoplasm of ovary: Secondary | ICD-10-CM

## 2016-02-07 DIAGNOSIS — Z170 Estrogen receptor positive status [ER+]: Principal | ICD-10-CM | POA: Insufficient documentation

## 2016-02-25 ENCOUNTER — Other Ambulatory Visit: Payer: Self-pay | Admitting: *Deleted

## 2016-02-25 DIAGNOSIS — C50919 Malignant neoplasm of unspecified site of unspecified female breast: Secondary | ICD-10-CM

## 2016-02-25 DIAGNOSIS — C50811 Malignant neoplasm of overlapping sites of right female breast: Secondary | ICD-10-CM

## 2016-02-25 DIAGNOSIS — Z170 Estrogen receptor positive status [ER+]: Principal | ICD-10-CM

## 2016-02-25 DIAGNOSIS — Z9189 Other specified personal risk factors, not elsewhere classified: Secondary | ICD-10-CM

## 2016-02-25 DIAGNOSIS — Z1509 Genetic susceptibility to other malignant neoplasm: Secondary | ICD-10-CM

## 2016-02-25 DIAGNOSIS — Z1589 Genetic susceptibility to other disease: Secondary | ICD-10-CM

## 2016-02-25 DIAGNOSIS — Z1502 Genetic susceptibility to malignant neoplasm of ovary: Secondary | ICD-10-CM

## 2016-03-25 ENCOUNTER — Telehealth: Payer: Self-pay | Admitting: Oncology

## 2016-03-25 NOTE — Telephone Encounter (Signed)
Faxed records to [facility]

## 2016-04-13 ENCOUNTER — Ambulatory Visit: Payer: BLUE CROSS/BLUE SHIELD | Admitting: Oncology

## 2016-04-13 ENCOUNTER — Other Ambulatory Visit: Payer: BLUE CROSS/BLUE SHIELD

## 2016-04-14 ENCOUNTER — Telehealth: Payer: Self-pay | Admitting: *Deleted

## 2016-04-14 NOTE — Telephone Encounter (Signed)
FYI "Could my appointment with Dr. Jana Hakim be moved into May?  I have so many appointments in April with other doctors.  I also need to schedule an MRI.  I want to come in after the MRI if he has anything available.  If he does not have anything available for May, I'll keep the April 25, 2016 appointment.   Return number [phone]"     Scheduling message sent.

## 2016-04-15 ENCOUNTER — Telehealth: Payer: Self-pay | Admitting: Oncology

## 2016-04-15 NOTE — Telephone Encounter (Signed)
Per 4/5 schedule message rescheduled April appointments to May at patient's request. Left message for patient re lab/fu for 5/23 and re calling central radiology to schedule mri for May prior to f/u with GM.  Per notes in order from central radiology has already reached out to patient re scheduling mri for April as originally order and per patient she would call back to reschedule the test for May.   Schedule mailed and reminder included re scheduling mri.

## 2016-04-25 ENCOUNTER — Other Ambulatory Visit: Payer: BLUE CROSS/BLUE SHIELD

## 2016-04-25 ENCOUNTER — Ambulatory Visit: Payer: BLUE CROSS/BLUE SHIELD | Admitting: Oncology

## 2016-05-23 ENCOUNTER — Ambulatory Visit
Admission: RE | Admit: 2016-05-23 | Discharge: 2016-05-23 | Disposition: A | Discharge status: Home / Self Care | Payer: BLUE CROSS/BLUE SHIELD | Source: Ambulatory Visit | Attending: Oncology | Admitting: Oncology

## 2016-05-23 DIAGNOSIS — C50919 Malignant neoplasm of unspecified site of unspecified female breast: Secondary | ICD-10-CM

## 2016-05-23 DIAGNOSIS — Z9189 Other specified personal risk factors, not elsewhere classified: Secondary | ICD-10-CM

## 2016-05-23 DIAGNOSIS — C50811 Malignant neoplasm of overlapping sites of right female breast: Secondary | ICD-10-CM

## 2016-05-23 DIAGNOSIS — Z170 Estrogen receptor positive status [ER+]: Principal | ICD-10-CM

## 2016-05-23 DIAGNOSIS — Z1509 Genetic susceptibility to other malignant neoplasm: Secondary | ICD-10-CM

## 2016-05-23 DIAGNOSIS — Z1589 Genetic susceptibility to other disease: Secondary | ICD-10-CM

## 2016-05-23 DIAGNOSIS — Z1502 Genetic susceptibility to malignant neoplasm of ovary: Secondary | ICD-10-CM

## 2016-05-23 MED ORDER — GADOBENATE DIMEGLUMINE 529 MG/ML IV SOLN
14.0000 mL | Freq: Once | INTRAVENOUS | Status: AC | PRN
Start: 2016-05-23 — End: 2016-05-23
  Administered 2016-05-23: 14 mL via INTRAVENOUS

## 2016-05-31 ENCOUNTER — Other Ambulatory Visit: Payer: Self-pay | Admitting: *Deleted

## 2016-05-31 DIAGNOSIS — Z1502 Genetic susceptibility to malignant neoplasm of ovary: Principal | ICD-10-CM

## 2016-05-31 DIAGNOSIS — Z1509 Genetic susceptibility to other malignant neoplasm: Principal | ICD-10-CM

## 2016-05-31 DIAGNOSIS — Z1589 Genetic susceptibility to other disease: Principal | ICD-10-CM

## 2016-05-31 DIAGNOSIS — C50919 Malignant neoplasm of unspecified site of unspecified female breast: Secondary | ICD-10-CM

## 2016-06-01 ENCOUNTER — Ambulatory Visit (HOSPITAL_BASED_OUTPATIENT_CLINIC_OR_DEPARTMENT_OTHER): Payer: BLUE CROSS/BLUE SHIELD | Admitting: Oncology

## 2016-06-01 ENCOUNTER — Other Ambulatory Visit (HOSPITAL_BASED_OUTPATIENT_CLINIC_OR_DEPARTMENT_OTHER): Payer: BLUE CROSS/BLUE SHIELD

## 2016-06-01 VITALS — BP 119/75 | HR 80 | Temp 98.2°F | Resp 18 | Ht 65.5 in | Wt 156.3 lb

## 2016-06-01 DIAGNOSIS — C50811 Malignant neoplasm of overlapping sites of right female breast: Secondary | ICD-10-CM

## 2016-06-01 DIAGNOSIS — Z853 Personal history of malignant neoplasm of breast: Secondary | ICD-10-CM

## 2016-06-01 DIAGNOSIS — Z1509 Genetic susceptibility to other malignant neoplasm: Principal | ICD-10-CM

## 2016-06-01 DIAGNOSIS — C50919 Malignant neoplasm of unspecified site of unspecified female breast: Secondary | ICD-10-CM

## 2016-06-01 DIAGNOSIS — Z1502 Genetic susceptibility to malignant neoplasm of ovary: Secondary | ICD-10-CM

## 2016-06-01 DIAGNOSIS — Z1589 Genetic susceptibility to other disease: Secondary | ICD-10-CM

## 2016-06-01 DIAGNOSIS — Z170 Estrogen receptor positive status [ER+]: Principal | ICD-10-CM

## 2016-06-01 LAB — COMPREHENSIVE METABOLIC PANEL
ALT: 16 U/L (ref 0–55)
AST: 19 U/L (ref 5–34)
Albumin: 4.1 g/dL (ref 3.5–5.0)
Alkaline Phosphatase: 49 U/L (ref 40–150)
Anion Gap: 10 mEq/L (ref 3–11)
BUN: 19 mg/dL (ref 7.0–26.0)
CHLORIDE: 106 meq/L (ref 98–109)
CO2: 24 mEq/L (ref 22–29)
Calcium: 9.2 mg/dL (ref 8.4–10.4)
Creatinine: 0.9 mg/dL (ref 0.6–1.1)
EGFR: 78 mL/min/{1.73_m2} — AB (ref >90)
GLUCOSE: 101 mg/dL (ref 70–140)
POTASSIUM: 4 meq/L (ref 3.5–5.1)
SODIUM: 140 meq/L (ref 136–145)
Total Bilirubin: 0.24 mg/dL (ref 0.20–1.20)
Total Protein: 7.1 g/dL (ref 6.4–8.3)

## 2016-06-01 LAB — CBC WITH DIFFERENTIAL/PLATELET
BASO%: 0.3 % (ref 0.0–2.0)
BASOS ABS: 0 10*3/uL (ref 0.0–0.1)
EOS ABS: 0.1 10*3/uL (ref 0.0–0.5)
EOS%: 1.7 % (ref 0.0–7.0)
HCT: 41.6 % (ref 34.8–46.6)
HGB: 14 g/dL (ref 11.6–15.9)
LYMPH%: 36.2 % (ref 14.0–49.7)
MCH: 31.5 pg (ref 25.1–34.0)
MCHC: 33.7 g/dL (ref 31.5–36.0)
MCV: 93.5 fL (ref 79.5–101.0)
MONO#: 0.5 10*3/uL (ref 0.1–0.9)
MONO%: 7.2 % (ref 0.0–14.0)
NEUT#: 3.8 10*3/uL (ref 1.5–6.5)
NEUT%: 54.6 % (ref 38.4–76.8)
Platelets: 293 10*3/uL (ref 145–400)
RBC: 4.45 10*6/uL (ref 3.70–5.45)
RDW: 12.6 % (ref 11.2–14.5)
WBC: 7 10*3/uL (ref 3.9–10.3)
lymph#: 2.5 10*3/uL (ref 0.9–3.3)

## 2016-06-01 NOTE — Progress Notes (Signed)
[facility]  Telephone:(336) [phone] Fax:(336) [phone]  Patient rescheduled 09/07/2015 visit   ID: Tresa Moore DOB: July 02, 1970  MR#: [id number]  PQW#:[id number]  Patient Care Team: [name], MD as PCP - General (Family Medicine) Cathey Endow, Scot Jun, DO (Inactive) (Family Medicine) Arminda Resides, MD as Consulting Physician (Dermatology) Magrinat, Valentino Hue, MD as Consulting Physician (Oncology) Julio Sicks, NP as Nurse Practitioner (Obstetrics and Gynecology) Charna Elizabeth, MD as Consulting Physician (Gastroenterology) Concepcion Elk, MD as Referring Physician (Internal Medicine) PCP: [name], MD Lowella Dell, MD OTHER MD:  CHIEF COMPLAINT: Estrogen receptor positive breast cancer  CURRENT TREATMENT: Intensified screening   BREAST CANCER HISTORY: From the earlier consult note:  "[name]" was diagnosed with right-sided breast cancer at the age of 44, in 2006. She had a lumpectomy and sentinel lymph node sampling October 2006 at the [facility] in [place] for what proved to be an invasive ductal carcinoma, grade 2, estrogen and progesterone receptor positive, HER-2 not amplified. She had a micrometastases in one of the 2 sentinel lymph nodes. 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 This showed a 0.6 cm residual tumor in the breast but all 10 additional lymph nodes were clear. Adjuvantly she was treated with doxorubicin and cyclophosphamide in dose dense fashion 4 followed by weekly paclitaxel 8, completed January 2007. She then received adjuvant radiation. After that she was on Zoladex and tamoxifen for 5 years.  She had immediate implant reconstruction in 2006, but this had to be removed because of a staph infection. She then had a latissimus flap which unfortunately also was compromised by staph infection.  After moving to [facility] in 2014 the more  recent data showing additional benefit from 10 years of tamoxifen compared to 5 was discussed and she resumed tamoxifen for a projected additional 5 years in 2014. She went off tamoxifen in 2016, at the 10 year anniversary of her surgery, on the advice of Dr. Theodora Blow at [facility]   Her subsequent history is as detailed below  INTERVAL HISTORY: [name] returns today for follow-up of her check 2 associated breast cancer. She is under intensified screening and had a breast MRI 05/23/2016 which showed in the right breast, which is status post mastectomy, no abnormal enhancement and no mass. The left breast was entirely normal as well. There were no abnormal appearing lymph nodes and no ancillary findings of concern.  Since her last visit here she also went colonoscopy, February 2018 under Dr. Caryl Never. She tells me her repeat is planned in 5 years. Finally she underwent a uterine "scraping" because of heavy periods. She is still having regular periods but now they are more "normal".  REVIEW OF SYSTEMS: [name] exercises vigorously, doing cross fit 3-4 times a week and taking walks on the days when she does not go to the gym. She is in an excellent diet, low lung carcinoma and vegetables. A detailed review of systems today was otherwise stable  PAST MEDICAL HISTORY: Past Medical History:  Diagnosis Date  . Family history of kidney cancer   . Family history of prostate cancer     PAST SURGICAL HISTORY: No past surgical history on file.  FAMILY HISTORY Family History  Problem Relation Age of Onset  . Kidney cancer Mother 60  . Prostate cancer Father 31  . Leukemia Maternal Uncle        died in his 7s  . Cancer Maternal Grandfather  unknown form  . Lung cancer Paternal Grandfather        smoker and a Ecologist   The patient's father was diagnosed with prostate cancer at age 69. The patient's mother had renal cancer at age 79 and died shortly thereafter. The patient has one brother, 2  sisters, one of whom is an M.D. There is no history of breast or ovarian, colon or uterine cancers in the family.  GYNECOLOGIC HISTORY:  No LMP recorded. Menarche age 29, first live birth age 40, the patient is GX P3. Menstrual cycles resumed after chemotherapy, and she is still having regular menstrual periods  SOCIAL HISTORY:  [name] works for [employer] as does her husband Shanon Brow. There are 3 daughters are [place], 16, Lauren, [place], and [place], 4 (ages as of October 2016). The patient is a Tourist information centre manager.   ADVANCED DIRECTIVES: The patient's husband is her healthcare part of attorney   HEALTH MAINTENANCE: Social History  Substance Use Topics  . Smoking status: Never Smoker  . Smokeless tobacco: Never Used  . Alcohol use Yes     Comment: rare     Colonoscopy: February 2018  PAP:  Bone density:[facility], 04/01/2015, with a T score of -1.2 (osteopenia)   Not on File  No current outpatient prescriptions on file.   No current facility-administered medications for this visit.     OBJECTIVE: Young white womanIn no acute distress Vitals:   06/01/16 1428  BP: 119/75  Pulse: 80  Resp: 18  Temp: 98.2 F (36.8 C)     Body mass index is 25.61 kg/m.      Sclerae unicteric, EOMs intact Oropharynx clear and moist No cervical or supraclavicular adenopathy Lungs no rales or rhonchi Heart regular rate and rhythm Abd soft, nontender, positive bowel sounds MSK no focal spinal tenderness, no upper extremity lymphedema Neuro: nonfocal, well oriented, appropriate affect Breasts: The right breast is status post mastectomy with latissimus reconstruction. There is no evidence of local recurrence. The left breast is benign. Both axillae are benign.  LAB RESULTS:  CMP     Component Value Date/Time   NA 140 06/01/2016 1409   K 4.0 06/01/2016 1409   CO2 24 06/01/2016 1409   GLUCOSE 101 06/01/2016 1409   BUN 19.0 06/01/2016 1409   CREATININE 0.9 06/01/2016 1409   CALCIUM 9.2  06/01/2016 1409   PROT 7.1 06/01/2016 1409   ALBUMIN 4.1 06/01/2016 1409   AST 19 06/01/2016 1409   ALT 16 06/01/2016 1409   ALKPHOS 49 06/01/2016 1409   BILITOT 0.24 06/01/2016 1409    INo results found for: SPEP, UPEP  Lab Results  Component Value Date   WBC 7.0 06/01/2016   NEUTROABS 3.8 06/01/2016   HGB 14.0 06/01/2016   HCT 41.6 06/01/2016   MCV 93.5 06/01/2016   PLT 293 06/01/2016      Chemistry      Component Value Date/Time   NA 140 06/01/2016 1409   K 4.0 06/01/2016 1409   CO2 24 06/01/2016 1409   BUN 19.0 06/01/2016 1409   CREATININE 0.9 06/01/2016 1409      Component Value Date/Time   CALCIUM 9.2 06/01/2016 1409   ALKPHOS 49 06/01/2016 1409   AST 19 06/01/2016 1409   ALT 16 06/01/2016 1409   BILITOT 0.24 06/01/2016 1409       No results found for: LABCA2  No components found for: LABCA125  No results for input(s): INR in the last 168 hours.  Urinalysis No results found for: COLORURINE, APPEARANCEUR, LABSPEC, PHURINE, GLUCOSEU, HGBUR, BILIRUBINUR, KETONESUR, PROTEINUR, UROBILINOGEN, NITRITE, LEUKOCYTESUR   STUDIES: Mr Breast Bilateral W Wo Contrast  Result Date: 05/23/2016 CLINICAL DATA:  High Risk patient with greater than 25% lifetime risk of breast cancer. History of right breast cancer status post mastectomy in 2006. LABS:  None obtained at the time of imaging. EXAM: BILATERAL BREAST MRI WITH AND WITHOUT CONTRAST TECHNIQUE: Multiplanar, multisequence MR images of both breasts were obtained prior to and following the intravenous administration of 14 ml of MultiHance. THREE-DIMENSIONAL MR IMAGE RENDERING ON INDEPENDENT WORKSTATION: Three-dimensional MR images were rendered by post-processing of the original MR data on an independent workstation. The three-dimensional MR images were interpreted, and findings are reported in the following complete MRI report for this study. Three dimensional images were evaluated at the independent DynaCad workstation  COMPARISON:  Previous exam(s). FINDINGS: Breast composition: b. Scattered fibroglandular tissue. Background parenchymal enhancement: Moderate. Right breast: Status post right mastectomy. No mass or abnormal enhancement. Left breast: No mass or abnormal enhancement. Lymph nodes: No abnormal appearing lymph nodes. Ancillary findings:  None. IMPRESSION: No abnormal enhancement in the right mastectomy site or left breast. RECOMMENDATION: Unilateral left screening mammogram in December of 2018 is recommended. Patient has a greater than 25% lifetime risk of developing breast cancer. The American Cancer Society recommends annual MRI and mammography in patients with an estimated lifetime risk of developing breast cancer greater than 20 - 25%, or who are known or suspected to be positive for the breast cancer gene. BI-RADS CATEGORY  1: Negative. Electronically Signed   By: Lillia [place] M.D.   On: 05/23/2016 13:10    ELIGIBLE FOR AVAILABLE RESEARCH PROTOCOL: No  ASSESSMENT: 46 y.o. CHEK2 positive [place] woman  (1) status post right lumpectomy and sentinel lymph node sampling in 2006 (age 20) for a pT1 pN1 (mic), stage IIA invasive ductal carcinoma, grade 2, estrogen and progesterone receptor positive, HER-2 not amplified, with positive margins  (2) status post right mastectomy and full axillary lymph node dissection 2006 showing an additional 0.6 cm of tumor, but clear margins and 0 of 10 axillary lymph nodes involved  (a) status post right breast reconstruction 2, including a latissimus flap, both failed secondary to staph infections   (3) treated adjuvantly with doxorubicin and cyclophosphamide in dose dense fashion 4 followed by paclitaxel 8  (4) completed adjuvant radiation spring of 2007  (5) received goserelin and tamoxifen for 5 years, (through 2012)  (6) resumed tamoxifen 2014, discontinuing it in 2016, for a total of 8 years of anti-estrogen therapy  (a) Is not clear that any  additional antiestrogen therapy would further reduce risk  (7) genetics testing 01/15/2016 through the Comprehensive Cancer Panel offered by [facility] identified a single, heterozygous pathogenic gene mutation called CHEK2, c.1100delC. There were no deleterious mutations in APC, ATM, AXIN2, BAP1, BARD1, BMPR1A, BRCA1, BRCA2, BRIP1, CDH1, CDK4, CDKN2A, CHEK2, EPCAM, FANCC, FH, FLCN, HOXB13, MET, MITF, MLH1, MSH2, MSH6, MUTYH, NBN, NF1, NTHL1, PALB2, PMS2, POLD1, POLE, POT1, PTEN, RAD51C, RAD51D, RECQL, SCG5/GREM1, SDHB, SDHC, SDHD, SMAD4, STK11, TP53, TSC!, TSC2 and VHL..  (a) will need yearly MRI of the breast in addition to yearly mammography  (b) colonoscopy 03/01/2016   PLAN: Tyrone Apple is now 10 years out from definitive surgery for her breast cancer with no evidence of disease recurrence. This is very favorable.  The overall plan is for intensified screening given her Chek 2 mutation and she will have mammography in November  at the [facility]. She will then have a repeat breast MRI may of 2019.  At this time she is well set up with her other physicians including gastroenterology, gynecology and dermatology. I'm going to see her on a once a year basis after her breast MRI.  She has a good understanding of this plan. She knows to call for any problems that may develop before her next visit here.  Chauncey Cruel, MD   06/02/2016 9:13 AM Medical Oncology and Hematology [facility] [address] [place], [place] [address] Tel. [phone]    Fax. [phone]

## 2016-06-13 ENCOUNTER — Other Ambulatory Visit: Payer: Self-pay | Admitting: *Deleted

## 2016-06-23 ENCOUNTER — Other Ambulatory Visit: Payer: Self-pay | Admitting: Oncology

## 2016-06-23 DIAGNOSIS — Z1231 Encounter for screening mammogram for malignant neoplasm of breast: Secondary | ICD-10-CM

## 2016-06-23 DIAGNOSIS — Z9011 Acquired absence of right breast and nipple: Secondary | ICD-10-CM

## 2016-07-19 ENCOUNTER — Telehealth: Payer: Self-pay | Admitting: Genetic Counselor

## 2016-07-19 NOTE — Telephone Encounter (Signed)
Per the lab, her OOP cost for her genetic testing will be $100.  Explained that regardless of what [employer] states her cost will be, [facility] will plan on billing her $100.

## 2016-10-13 ENCOUNTER — Ambulatory Visit: Payer: BLUE CROSS/BLUE SHIELD | Admitting: Oncology

## 2016-12-07 ENCOUNTER — Encounter: Payer: Self-pay | Admitting: Oncology

## 2016-12-07 ENCOUNTER — Other Ambulatory Visit: Payer: Self-pay | Admitting: Oncology

## 2016-12-09 ENCOUNTER — Encounter: Payer: Self-pay | Admitting: Oncology

## 2016-12-21 ENCOUNTER — Ambulatory Visit
Admission: RE | Admit: 2016-12-21 | Discharge: 2016-12-21 | Disposition: A | Discharge status: Home / Self Care | Payer: BLUE CROSS/BLUE SHIELD | Source: Ambulatory Visit | Attending: Oncology | Admitting: Oncology

## 2016-12-21 ENCOUNTER — Encounter: Payer: Self-pay | Admitting: Oncology

## 2016-12-21 DIAGNOSIS — Z9011 Acquired absence of right breast and nipple: Secondary | ICD-10-CM

## 2016-12-21 DIAGNOSIS — Z1231 Encounter for screening mammogram for malignant neoplasm of breast: Secondary | ICD-10-CM

## 2016-12-22 ENCOUNTER — Encounter: Payer: Self-pay | Admitting: Oncology

## 2016-12-23 ENCOUNTER — Telehealth: Payer: Self-pay

## 2016-12-23 NOTE — Telephone Encounter (Signed)
Spoke with pt by phone and informed her that documents have been completed and faxed to [place]. Pt request a copy be mailed to her.  A copy has been placed in the mail and the original will be sent to scan into [facility]

## 2017-06-12 ENCOUNTER — Ambulatory Visit: Payer: BLUE CROSS/BLUE SHIELD | Admitting: Oncology

## 2017-06-12 ENCOUNTER — Other Ambulatory Visit: Payer: BLUE CROSS/BLUE SHIELD

## 2017-06-19 NOTE — Progress Notes (Signed)
[place]  [phone] Fax:([phone]) [phone]  Patient rescheduled 09/07/2015 visit   ID: Erika Foster DOB: 14-Jun-1970  MR#: [id number]  QMV#:[id number]  Patient Care Team: Erika Noon, MD as PCP - General (Family Medicine) Erika Foster, Erika Leyden, DO (Inactive) (Family Medicine) Erika Sensing, MD as Consulting Physician (Dermatology) Erika Foster, Erika Dad, MD as Consulting Physician (Oncology) Erika Chance, NP as Nurse Practitioner (Obstetrics and Gynecology) Erika Craver, MD as Consulting Physician (Gastroenterology) Erika Friendly, MD as Referring Physician (Internal Medicine) PCP: Erika Noon, MD Shawnie Dapper OTHER MD:  CHIEF COMPLAINT: Estrogen receptor positive breast cancer  CURRENT TREATMENT: Intensified screening   BREAST CANCER HISTORY: From the earlier consult note:  "Erika Foster" was diagnosed with right-sided breast cancer at the age of 47, in 2006. She had a lumpectomy and sentinel lymph node sampling October 2006 at the [facility] in [place] for what proved to be an invasive ductal carcinoma, grade 2, estrogen and progesterone receptor positive, HER-2 not amplified. She had a micrometastases in one of the 2 sentinel lymph nodes. 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 This showed a 0.6 cm residual tumor in the breast but all 10 additional lymph nodes were clear. Adjuvantly she was treated with doxorubicin and cyclophosphamide in dose dense fashion 4 followed by weekly paclitaxel 8, completed January 2007. She then received adjuvant radiation. After that she was on Zoladex and tamoxifen for 5 years.  She had immediate implant reconstruction in 2006, but this had to be removed because of a staph infection. She then had a latissimus flap which unfortunately also was compromised by staph infection.  After moving to [facility] in 2014 the more  recent data showing additional benefit from 10 years of tamoxifen compared to 5 was discussed and she resumed tamoxifen for a projected additional 5 years in 2014. She went off tamoxifen in 2016, at the 10 year anniversary of her surgery, on the advice of Dr. 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 She is under intensified screening.   Since her last visit, she underwent routine screening bilateral mammography with CAD and tomography on 12/21/2016 at [place] showing: breast density category B. There was no evidence of malignancy.   She also completed a bilateral breast MRI at the [facility] of this year.  However that is not coming up on our EMR.  We are requesting a fax copy of the report  REVIEW OF SYSTEMS: Erika Foster reports that she is generally doing well. For exercise, she does cross-fit traning 4 days per week, and she walks on the other days. She takes Qsimia to help maintain her weight. She follows up with her dietitian at [facility]. She also takes 10 mg of Prozac.  This helps her control her stress, which is related to her work (she works full-time outside of her home) and taking care of the children and other family issues.  Even though she is doing so well, she "barely feel I am keeping my head above water".  She denies unusual headaches, visual changes, nausea, vomiting, or dizziness. There has been no unusual cough, phlegm production, or pleurisy. This been no change in bowel or bladder habits. She denies unexplained fatigue or unexplained weight loss, bleeding, rash, or fever. A detailed review of systems was otherwise stable.  PAST MEDICAL HISTORY: Past Medical History:  Diagnosis Date  . Family history of kidney cancer   . Family history of prostate cancer     PAST SURGICAL HISTORY: No past surgical history on file.  FAMILY HISTORY Family  History  Problem Relation Age of Onset  . Kidney cancer Mother 67  . Prostate cancer Father 61  . Leukemia Maternal Uncle        died in his 103s  . Cancer Maternal Grandfather        unknown form  . Lung cancer Paternal Grandfather        smoker and a Ecologist  . Breast cancer Neg Hx    The patient's father was diagnosed with prostate cancer at age 77. The patient's mother had renal cancer at age 4 and died shortly thereafter. The patient has one brother, 2 sisters, one of whom is an M.D. There is no history of breast or ovarian, colon or uterine cancers in the family.  GYNECOLOGIC HISTORY:  No LMP recorded. Menarche age 62, first live birth age 30, the patient is GX P3. Menstrual cycles resumed after chemotherapy, and she is still having regular menstrual periods  SOCIAL HISTORY:  Erika Foster works for [employer] as does her husband Erika Foster. There are 3 daughters are [place], 16, Lauren, [place], and lane, 4 (ages as of October 2016). The patient is a Tourist information centre manager.   ADVANCED DIRECTIVES: The patient's husband is her healthcare part of attorney   HEALTH MAINTENANCE: Social History   Tobacco Use  . Smoking status: Never Smoker  . Smokeless tobacco: Never Used  Substance Use Topics  . Alcohol use: Yes    Comment: rare  . Drug use: No     Colonoscopy: February 2018  P[facility]  [facility], with a T score of -1.2 (osteopenia)   Not on File  No current outpatient medications on file.   No current facility-administered medications for this visit.     OBJECTIVE: Young white woman who appears stated age  47:   06/21/17 1310  BP: 114/74  Pulse: 92  Resp: 18  Temp: 98.5 F (36.9 C)  SpO2: 100%     Body mass index is 27.11 kg/m.      Sclerae unicteric, pupils round and equal No cervical or supraclavicular adenopathy Lungs no rales or rhonchi Heart regular rate and rhythm Abd soft, nontender, positive bowel sounds MSK no focal spinal  tenderness, no upper extremity lymphedema Neuro: nonfocal, well oriented, appropriate affect Breasts: The right breast is status post mastectomy and status post latissimus reconstruction.  There is no evidence of local recurrence.  The left breast is benign.  Both axillae are benign.  LAB RESULTS:  CMP     Component Value Date/Time   NA 140 06/01/2016 1409   K 4.0 06/01/2016 1409   CO2 24 06/01/2016 1409   GLUCOSE 101 06/01/2016 1409   BUN 19.0 06/01/2016 1409   CREATININE 0.9 06/01/2016 1409   CALCIUM 9.2 06/01/2016 1409   PROT 7.1 06/01/2016 1409   ALBUMIN 4.1 06/01/2016 1409   AST 19 06/01/2016 1409   ALT 16 06/01/2016 1409   ALKPHOS 49 06/01/2016 1409   BILITOT 0.24 06/01/2016 1409    INo results found for: SPEP, UPEP  Lab Results  Component Value Date   WBC 6.8 06/21/2017   NEUTROABS 3.4 06/21/2017   HGB 14.4 06/21/2017   HCT 42.2 06/21/2017   MCV 93.2 06/21/2017   PLT 302  06/21/2017      Chemistry      Component Value Date/Time   NA 140 06/01/2016 1409   K 4.0 06/01/2016 1409   CO2 24 06/01/2016 1409   BUN 19.0 06/01/2016 1409   CREATININE 0.9 06/01/2016 1409      Component Value Date/Time   CALCIUM 9.2 06/01/2016 1409   ALKPHOS 49 06/01/2016 1409   AST 19 06/01/2016 1409   ALT 16 06/01/2016 1409   BILITOT 0.24 06/01/2016 1409       No results found for: LABCA2  No components found for: LABCA125  No results for input(s): INR in the last 168 hours.  Urinalysis No results found for: COLORURINE, APPEARANCEUR, LABSPEC, PHURINE, GLUCOSEU, HGBUR, BILIRUBINUR, KETONESUR, PROTEINUR, UROBILINOGEN, NITRITE, LEUKOCYTESUR   STUDIES: Since her last visit, she underwent routine screening unilateral left mammography with CAD and tomography on 12/21/2016 at [place] showing: breast density category B. There was no evidence of malignancy.   ELIGIBLE FOR AVAILABLE RESEARCH PROTOCOL: No  ASSESSMENT: 47 y.o. CHEK2 positive [place]  woman  (1) status post right lumpectomy and sentinel lymph node sampling in 2006 (age 43) for a pT1 pN1 (mic), stage IIA invasive ductal carcinoma, grade 2, estrogen and progesterone receptor positive, HER-2 not amplified, with positive margins  (2) status post right mastectomy and full axillary lymph node dissection 2006 showing an additional 0.6 cm of tumor, but clear margins and 0 of 10 axillary lymph nodes involved  (a) status post right breast reconstruction 2, including a latissimus flap, both failed secondary to staph infections   (3) treated adjuvantly with doxorubicin and cyclophosphamide in dose dense fashion 4 followed by paclitaxel 8  (4) completed adjuvant radiation spring of 2007  (5) received goserelin and tamoxifen for 5 years, (through 2012)  (6) resumed tamoxifen 2014, discontinuing it in 2016, for a total of 8 years of anti-estrogen therapy  (a) Is not clear that any additional antiestrogen therapy would further reduce risk  (7) genetics testing 01/15/2016 through the Comprehensive Cancer Panel offered by [facility] identified a single, heterozygous pathogenic gene mutation called CHEK2, c.[id number]. There were no deleterious mutations in APC, ATM, AXIN2, BAP1, BARD1, BMPR1A, BRCA1, BRCA2, BRIP1, CDH1, CDK4, CDKN2A, CHEK2, EPCAM, FANCC, FH, FLCN, HOXB13, MET, MITF, MLH1, MSH2, MSH6, MUTYH, NBN, NF1, NTHL1, PALB2, PMS2, POLD1, POLE, POT1, PTEN, RAD51C, RAD51D, RECQL, SCG5/GREM1, SDHB, SDHC, SDHD, SMAD4, STK11, TP53, TSC!, TSC2 and VHL..  (a) will need yearly MRI of the breast in addition to yearly mammography  (b) colonoscopy 03/01/2016 (repeat every 5 years)   PLAN: Laurence Aly is now 13 years out from definitive surgery for her right sided breast cancer with no evidence of disease recurrence.  This is very favorable.  She does have a deleterious CHEK2 mutation, which we are dealing with through intensified screening.  She is quite sure that she had an MRI of the breast last  month, but we do not have a record of it.  Of course I have no reason to doubt her.  We are contacting [facility] imaging to get a written copy of that report.  She is finding the fluoxetine is helpful in terms of stress management.  Of course her exercise is also a good form of stress management.  Nevertheless she tells me it would be very difficult for her to meet her health and family needs if she were not able to work outside the home.  Luckily her company is allowing her to do that  She will be  due for repeat mammography in December and then again repeat breast MRI May of next year.  She will see me again in June of next year, with lab work the same day  She knows to call for any issues that may develop before the next visit.      Radiah Lubinski, Erika Dad, MD  06/21/17 1:30 PM Medical Oncology and Hematology [facility] [address] [place], [place] [address] Tel. [phone]    Fax. [phone]  Alice Rieger, am acting as scribe for Chauncey Cruel MD.  I, Lurline Del MD, have reviewed the above documentation for accuracy and completeness, and I agree with the above.    Breast Management Options We reviewed the NCCN practice guidelines (v1.2018) for breast management for women at an increased risk of breast cancer because of CHEK2 mutations:   1. Breast awareness (which may include periodic, consistent breast self exam) starting at age 2.  2. Breast screening:   Starting at age 1, annual mammogram and breast MRI screening, or starting 10 years younger than the earliest age of onset.   Colon Cancer Management:  Men and women with a deleterious CHEK2 mutation may have up to a 10% lifetime risk for colon cancer. The following is recommended for individuals with a CHEK2 mutation:  Personal history of colon cancer             Follow instructions provided by your physician based on your personal history.  Do not have a personal history of colon  cancer but have a parent/sibling/child with colon cancer: Colonoscopy every 5 years starting at age 53 or 49 years younger than the earliest age of onset, whichever is younger.  Do not have a personal history of colon cancer but do not have a parent/sibling/child with colon cancer: Colonoscopy every 5 years starting at age 17.   ADDENDUM:  After the visit Erika Foster recalled that she actually had not had an MRI of the breast this year.  She does agree to have it done.  The order is being placed

## 2017-06-20 ENCOUNTER — Other Ambulatory Visit: Payer: Self-pay | Admitting: *Deleted

## 2017-06-20 DIAGNOSIS — C50811 Malignant neoplasm of overlapping sites of right female breast: Secondary | ICD-10-CM

## 2017-06-20 DIAGNOSIS — Z170 Estrogen receptor positive status [ER+]: Principal | ICD-10-CM

## 2017-06-21 ENCOUNTER — Inpatient Hospital Stay: Payer: BLUE CROSS/BLUE SHIELD | Attending: Oncology

## 2017-06-21 ENCOUNTER — Telehealth: Payer: Self-pay | Admitting: Oncology

## 2017-06-21 ENCOUNTER — Inpatient Hospital Stay (HOSPITAL_BASED_OUTPATIENT_CLINIC_OR_DEPARTMENT_OTHER): Payer: BLUE CROSS/BLUE SHIELD | Admitting: Oncology

## 2017-06-21 VITALS — BP 114/74 | HR 92 | Temp 98.5°F | Resp 18 | Ht 65.5 in | Wt 165.4 lb

## 2017-06-21 DIAGNOSIS — Z1502 Genetic susceptibility to malignant neoplasm of ovary: Secondary | ICD-10-CM

## 2017-06-21 DIAGNOSIS — Z853 Personal history of malignant neoplasm of breast: Secondary | ICD-10-CM | POA: Diagnosis not present

## 2017-06-21 DIAGNOSIS — Z1509 Genetic susceptibility to other malignant neoplasm: Secondary | ICD-10-CM | POA: Diagnosis not present

## 2017-06-21 DIAGNOSIS — Z9011 Acquired absence of right breast and nipple: Secondary | ICD-10-CM | POA: Diagnosis not present

## 2017-06-21 DIAGNOSIS — Z1589 Genetic susceptibility to other disease: Secondary | ICD-10-CM

## 2017-06-21 DIAGNOSIS — Z170 Estrogen receptor positive status [ER+]: Principal | ICD-10-CM

## 2017-06-21 DIAGNOSIS — Z1501 Genetic susceptibility to malignant neoplasm of breast: Secondary | ICD-10-CM | POA: Diagnosis not present

## 2017-06-21 DIAGNOSIS — C50811 Malignant neoplasm of overlapping sites of right female breast: Secondary | ICD-10-CM

## 2017-06-21 DIAGNOSIS — C50919 Malignant neoplasm of unspecified site of unspecified female breast: Secondary | ICD-10-CM

## 2017-06-21 LAB — CBC WITH DIFFERENTIAL (CANCER CENTER ONLY)
BASOS ABS: 0 10*3/uL (ref 0.0–0.1)
Basophils Relative: 0 %
Eosinophils Absolute: 0.2 10*3/uL (ref 0.0–0.5)
Eosinophils Relative: 3 %
HEMATOCRIT: 42.2 % (ref 34.8–46.6)
HEMOGLOBIN: 14.4 g/dL (ref 11.6–15.9)
Lymphocytes Relative: 40 %
Lymphs Abs: 2.8 10*3/uL (ref 0.9–3.3)
MCH: 31.8 pg (ref 25.1–34.0)
MCHC: 34.1 g/dL (ref 31.5–36.0)
MCV: 93.2 fL (ref 79.5–101.0)
MONOS PCT: 7 %
Monocytes Absolute: 0.5 10*3/uL (ref 0.1–0.9)
NEUTROS ABS: 3.4 10*3/uL (ref 1.5–6.5)
NEUTROS PCT: 50 %
Platelet Count: 302 10*3/uL (ref 145–400)
RBC: 4.53 MIL/uL (ref 3.70–5.45)
RDW: 12.9 % (ref 11.2–14.5)
WBC Count: 6.8 10*3/uL (ref 3.9–10.3)

## 2017-06-21 LAB — CMP (CANCER CENTER ONLY)
ALK PHOS: 46 U/L (ref 40–150)
ALT: 9 U/L (ref 0–55)
AST: 18 U/L (ref 5–34)
Albumin: 4.3 g/dL (ref 3.5–5.0)
Anion gap: 8 (ref 3–11)
BILIRUBIN TOTAL: 0.3 mg/dL (ref 0.2–1.2)
BUN: 18 mg/dL (ref 7–26)
CALCIUM: 9.2 mg/dL (ref 8.4–10.4)
CO2: 24 mmol/L (ref 22–29)
CREATININE: 0.9 mg/dL (ref 0.60–1.10)
Chloride: 105 mmol/L (ref 98–109)
GFR, Est AFR Am: >60 mL/min (ref >60)
GFR, Estimated: >60 mL/min (ref >60)
Glucose, Bld: 124 mg/dL (ref 70–140)
Potassium: 3.8 mmol/L (ref 3.5–5.1)
Sodium: 137 mmol/L (ref 136–145)
TOTAL PROTEIN: 7.3 g/dL (ref 6.4–8.3)

## 2017-06-21 NOTE — Telephone Encounter (Signed)
Gave patient avs and calendar of upcoming June 2020 appointments.  °

## 2017-09-22 ENCOUNTER — Ambulatory Visit
Admission: RE | Admit: 2017-09-22 | Discharge: 2017-09-22 | Disposition: A | Discharge status: Home / Self Care | Payer: BLUE CROSS/BLUE SHIELD | Source: Ambulatory Visit | Attending: Oncology | Admitting: Oncology

## 2017-09-22 DIAGNOSIS — C50811 Malignant neoplasm of overlapping sites of right female breast: Secondary | ICD-10-CM

## 2017-09-22 DIAGNOSIS — Z1509 Genetic susceptibility to other malignant neoplasm: Secondary | ICD-10-CM

## 2017-09-22 DIAGNOSIS — C50919 Malignant neoplasm of unspecified site of unspecified female breast: Secondary | ICD-10-CM

## 2017-09-22 DIAGNOSIS — Z1589 Genetic susceptibility to other disease: Secondary | ICD-10-CM

## 2017-09-22 DIAGNOSIS — Z1502 Genetic susceptibility to malignant neoplasm of ovary: Secondary | ICD-10-CM

## 2017-09-22 DIAGNOSIS — Z170 Estrogen receptor positive status [ER+]: Principal | ICD-10-CM

## 2017-09-22 MED ORDER — GADOBENATE DIMEGLUMINE 529 MG/ML IV SOLN
15.0000 mL | Freq: Once | INTRAVENOUS | Status: AC | PRN
Start: 2017-09-22 — End: 2017-09-22
  Administered 2017-09-22: 15 mL via INTRAVENOUS

## 2018-01-23 ENCOUNTER — Other Ambulatory Visit: Payer: Self-pay | Admitting: Oncology

## 2018-01-23 DIAGNOSIS — Z1231 Encounter for screening mammogram for malignant neoplasm of breast: Secondary | ICD-10-CM

## 2018-02-21 ENCOUNTER — Ambulatory Visit
Admission: RE | Admit: 2018-02-21 | Discharge: 2018-02-21 | Disposition: A | Discharge status: Home / Self Care | Payer: BLUE CROSS/BLUE SHIELD | Source: Ambulatory Visit | Attending: Oncology | Admitting: Oncology

## 2018-02-21 DIAGNOSIS — Z1231 Encounter for screening mammogram for malignant neoplasm of breast: Secondary | ICD-10-CM

## 2018-06-06 ENCOUNTER — Telehealth: Payer: Self-pay | Admitting: Oncology

## 2018-06-06 NOTE — Telephone Encounter (Signed)
[place] 6/24 moved appointment to 6/26. Left message. Schedule mailed.

## 2018-06-20 ENCOUNTER — Other Ambulatory Visit: Payer: BLUE CROSS/BLUE SHIELD

## 2018-06-20 ENCOUNTER — Ambulatory Visit: Payer: BLUE CROSS/BLUE SHIELD | Admitting: Oncology

## 2018-07-02 ENCOUNTER — Telehealth: Payer: Self-pay | Admitting: Oncology

## 2018-07-02 ENCOUNTER — Encounter: Payer: Self-pay | Admitting: Oncology

## 2018-07-02 NOTE — Telephone Encounter (Signed)
I left a message regarding reschedule per patient request

## 2018-07-04 ENCOUNTER — Other Ambulatory Visit: Payer: BLUE CROSS/BLUE SHIELD

## 2018-07-04 ENCOUNTER — Ambulatory Visit: Payer: BLUE CROSS/BLUE SHIELD | Admitting: Oncology

## 2018-07-06 ENCOUNTER — Ambulatory Visit: Payer: BLUE CROSS/BLUE SHIELD | Admitting: Oncology

## 2018-07-06 ENCOUNTER — Other Ambulatory Visit: Payer: BLUE CROSS/BLUE SHIELD

## 2018-08-05 NOTE — Progress Notes (Signed)
No show

## 2018-08-06 ENCOUNTER — Encounter: Payer: Self-pay | Admitting: Oncology

## 2018-08-06 ENCOUNTER — Inpatient Hospital Stay (HOSPITAL_BASED_OUTPATIENT_CLINIC_OR_DEPARTMENT_OTHER): Payer: BC Managed Care – PPO | Admitting: Oncology

## 2018-08-06 ENCOUNTER — Inpatient Hospital Stay: Payer: BC Managed Care – PPO | Attending: Family Medicine

## 2018-08-06 ENCOUNTER — Telehealth: Payer: Self-pay | Admitting: Oncology

## 2018-08-06 DIAGNOSIS — C50811 Malignant neoplasm of overlapping sites of right female breast: Secondary | ICD-10-CM

## 2018-08-06 DIAGNOSIS — Z170 Estrogen receptor positive status [ER+]: Secondary | ICD-10-CM

## 2018-08-06 DIAGNOSIS — C50919 Malignant neoplasm of unspecified site of unspecified female breast: Secondary | ICD-10-CM

## 2018-08-06 DIAGNOSIS — Z1502 Genetic susceptibility to malignant neoplasm of ovary: Secondary | ICD-10-CM

## 2018-08-06 DIAGNOSIS — Z1589 Genetic susceptibility to other disease: Secondary | ICD-10-CM

## 2018-08-06 DIAGNOSIS — Z1509 Genetic susceptibility to other malignant neoplasm: Secondary | ICD-10-CM

## 2018-08-06 NOTE — Telephone Encounter (Signed)
Called pt per 7/27 sch message to r/s appt - left message for patient to call back for r/s

## 2018-08-10 ENCOUNTER — Encounter: Payer: Self-pay | Admitting: Oncology

## 2018-08-14 ENCOUNTER — Telehealth: Payer: Self-pay | Admitting: Oncology

## 2018-08-14 NOTE — Telephone Encounter (Signed)
Scheduled appt per 8/04 sch message - unable to reach pt . Left message with appt date time

## 2018-08-25 IMAGING — MR MR BILATERAL BREAST WITHOUT AND WITH CONTRAST
7 of 12 series · 26 of 48 positions shown · IV contrast (multihance)
Comparison: Previous exam(s).

CLINICAL DATA: High Risk patient with greater than 25% lifetime
risk of breast cancer. History of right breast cancer status post
mastectomy in 8112.

LABS:  None obtained at the time of imaging.
EXAM:
BILATERAL BREAST MRI WITH AND WITHOUT CONTRAST
TECHNIQUE: Multiplanar, multisequence MR images of both breasts were obtained
prior to and following the intravenous administration of 14 ml of
MultiHance.

[Series 2: STIR · axial · 3.0mm · 0.94mm/px · 1 of 61 slices shown]
[im 1/61]
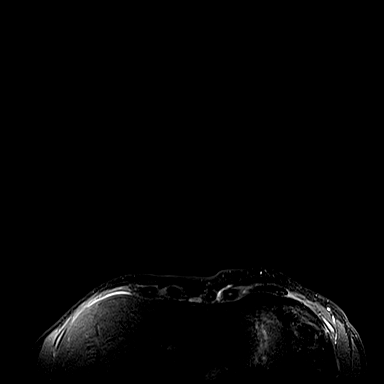

[Series 3: fl3d pre no · axial · non-contrast · 1.0mm · 0.80mm/px · z∈[-70,+137]mm · 5 of 208 slices shown]
[im 1/208]
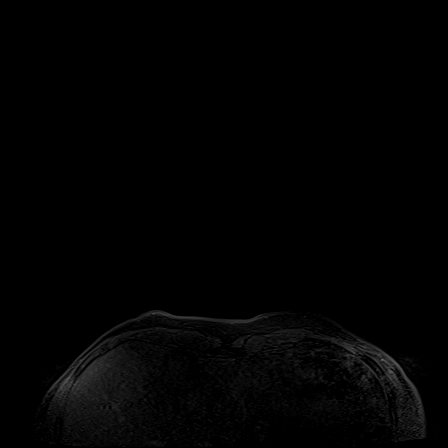
[im 52/208]
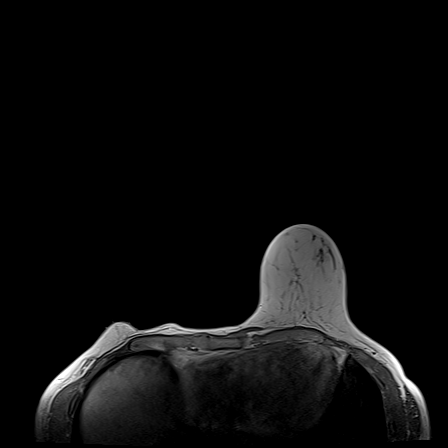
[im 104/208]
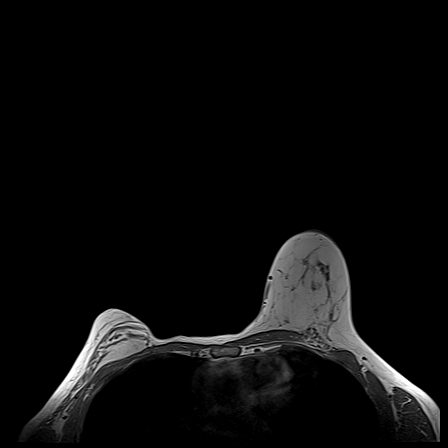
[im 156/208]
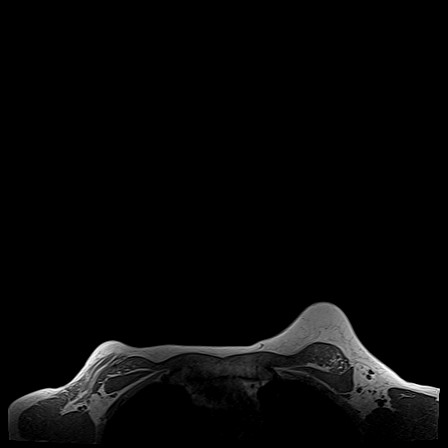
[im 208/208]
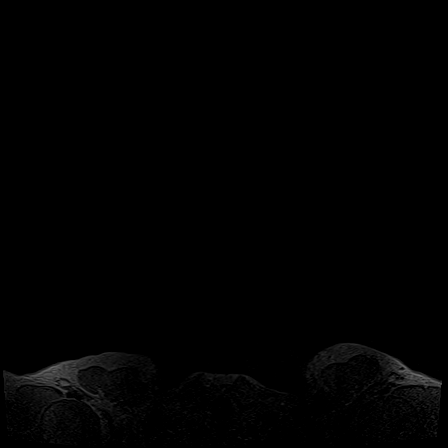

[Series 6: axial pre fs · axial · non-contrast · 1.0mm · 0.80mm/px · z∈[-70,+137]mm · 5 of 208 slices shown]
[im 1/208]
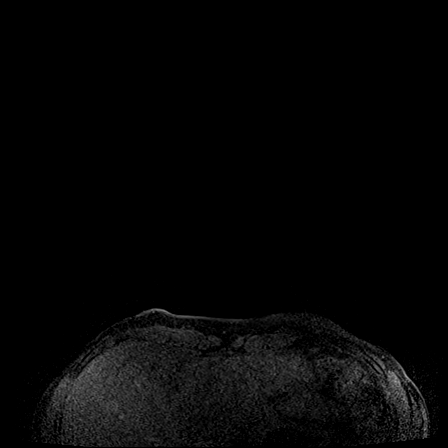
[im 52/208]
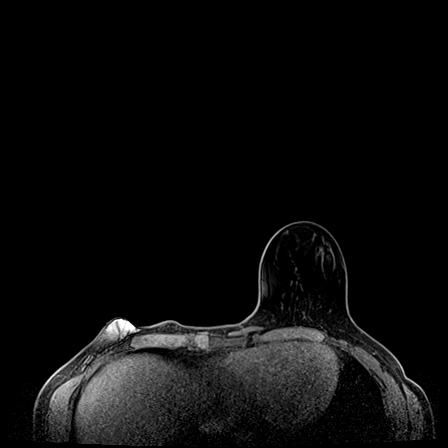
[im 104/208]
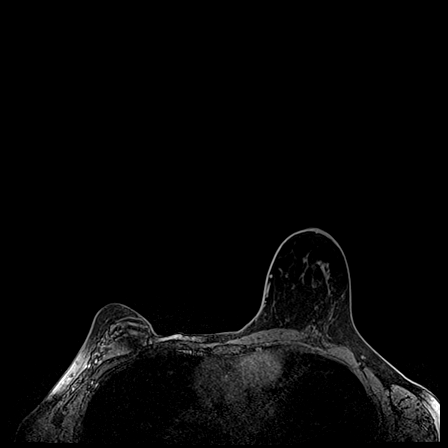
[im 156/208]
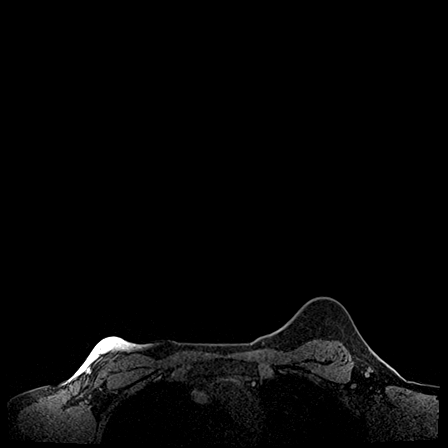
[im 208/208]
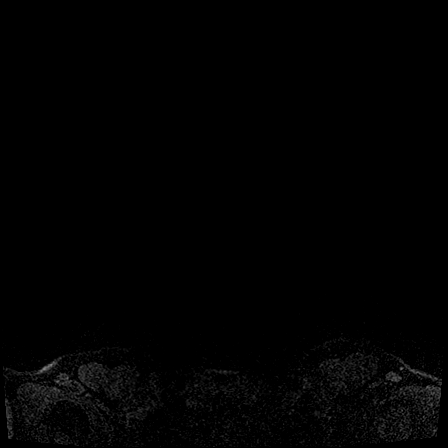

[Series 7: axial post 20 · axial · 1.0mm · 0.80mm/px · z∈[-70,+137]mm · 5 of 208 slices shown (1 of 3)]
[im 1/208]
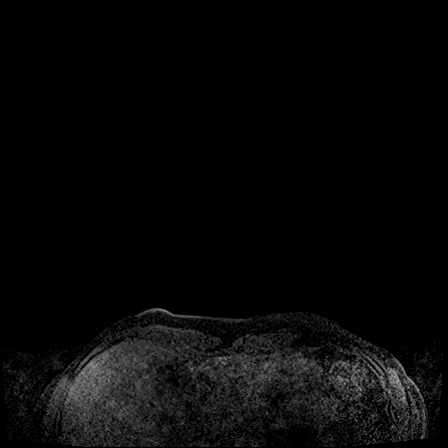
[im 52/208]
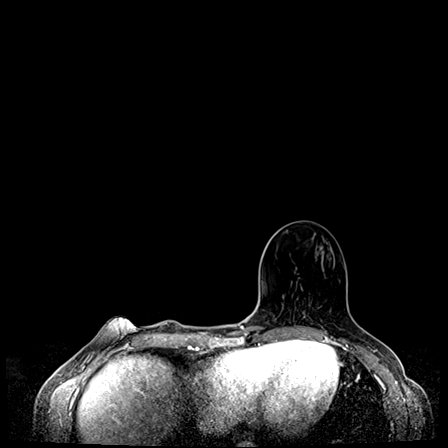
[im 104/208]
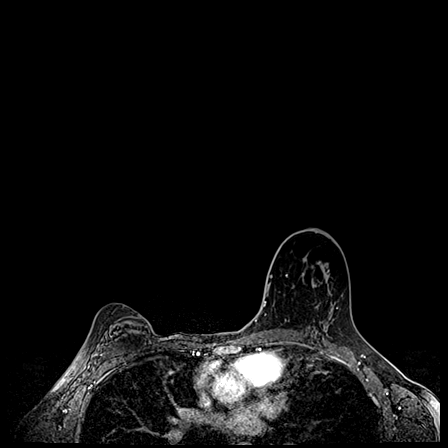
[im 156/208]
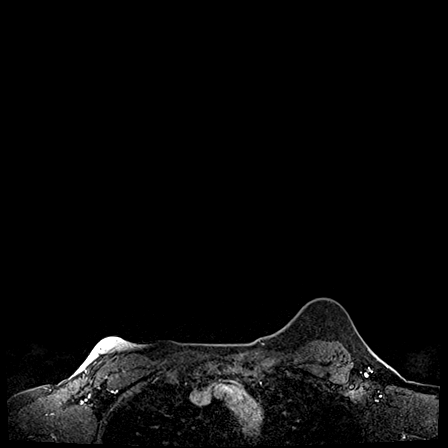
[im 208/208]
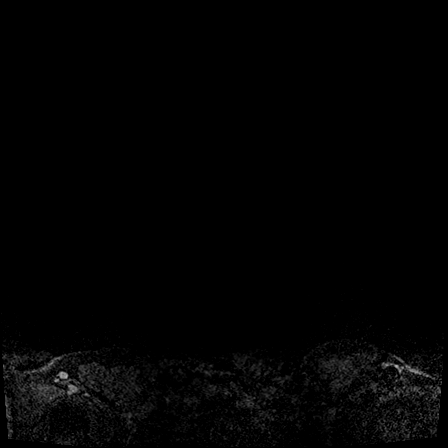

[Series 8: axial post 20 · axial · 1.0mm · 0.80mm/px · z∈[-70,+137]mm · 5 of 208 slices shown (2 of 3)]
[im 1/208]
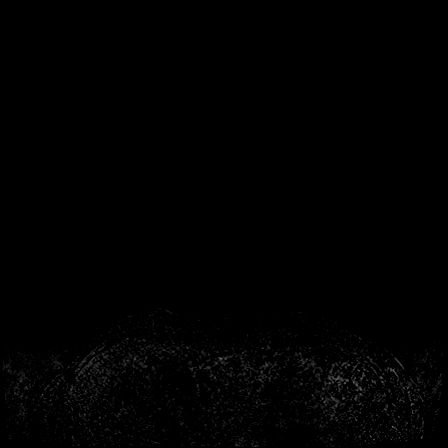
[im 52/208]
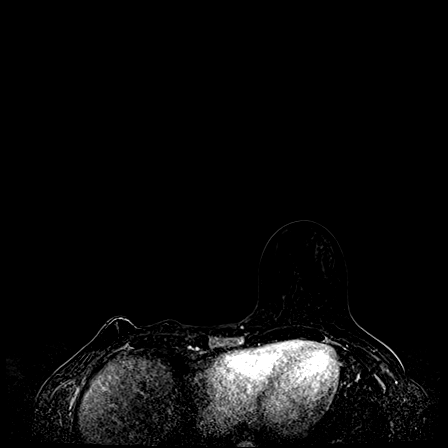
[im 104/208]
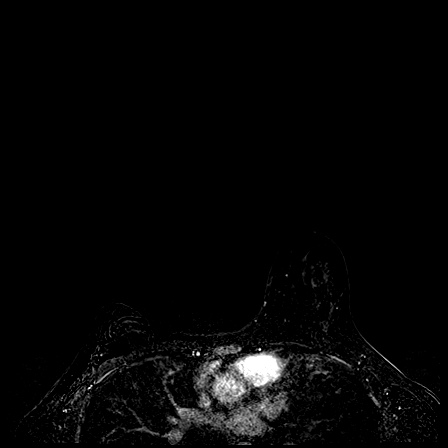
[im 156/208]
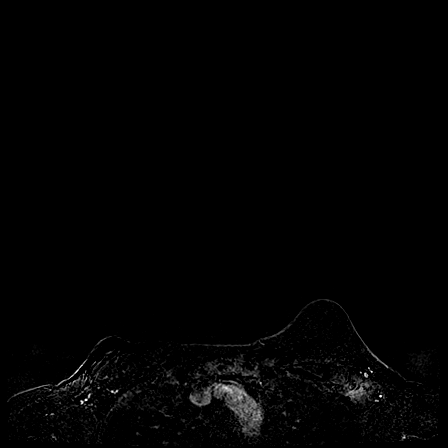
[im 208/208]
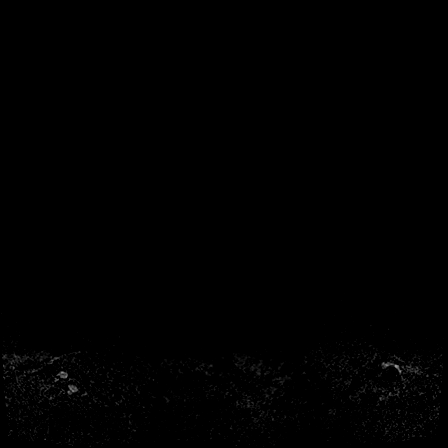

[Series 9: axial post 20 · axial · 208.0mm · 0.80mm/px · 1 of 1 slices shown (3 of 3)]
[im 1/1]
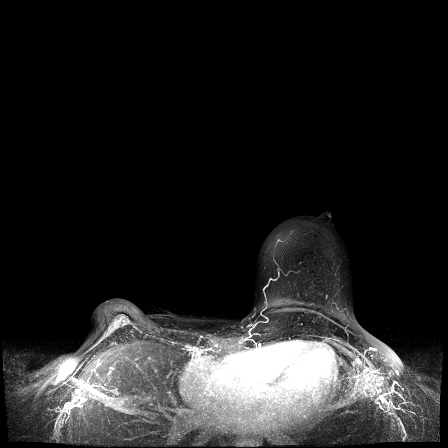

[Series 10: axial post 3 · axial · 1.0mm · 0.80mm/px · z∈[-70,+54]mm · 4 of 208 slices shown]
[im 1/208]
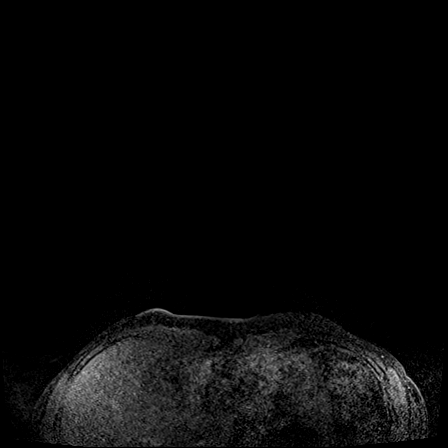
[im 42/208]
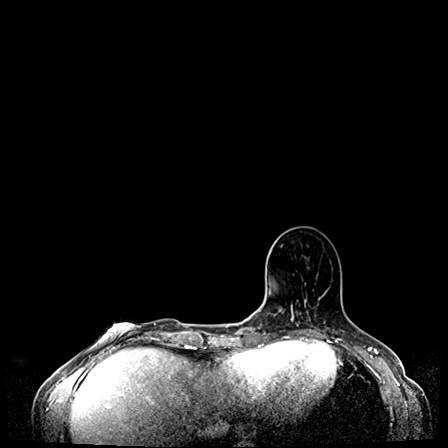
[im 83/208]
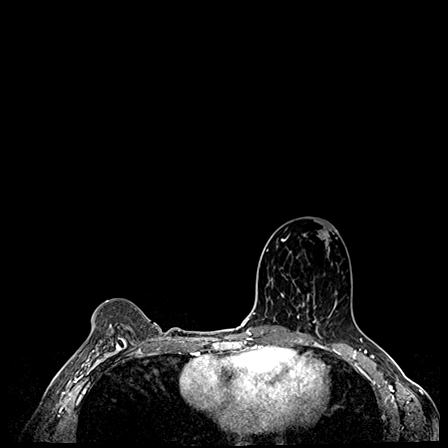
[im 125/208]
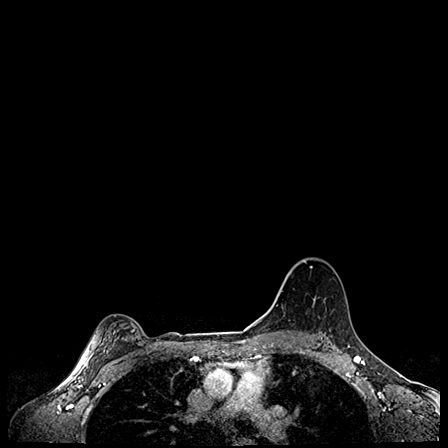

[26 of 48 positions shown; findings below may reference images not displayed]

THREE-DIMENSIONAL MR IMAGE RENDERING ON INDEPENDENT WORKSTATION:

Three-dimensional MR images were rendered by post-processing of the
original MR data on an independent workstation. The
three-dimensional MR images were interpreted, and findings are
reported in the following complete MRI report for this study. Three
dimensional images were evaluated at the independent [employer]
workstation
FINDINGS: Breast composition: b. Scattered fibroglandular tissue.

Background parenchymal enhancement: Moderate.

Right breast: Status post right mastectomy. No mass or abnormal
enhancement.

Left breast: No mass or abnormal enhancement.

Lymph nodes: No abnormal appearing lymph nodes.

Ancillary findings:  None.
IMPRESSION: No abnormal enhancement in the right mastectomy site or left breast.

RECOMMENDATION:
Unilateral left screening mammogram in Saturday December, 2016 is
recommended.

Patient has a greater than 25% lifetime risk of developing breast
cancer. The American Cancer Society recommends annual MRI and
mammography in patients with an estimated lifetime risk of
developing breast cancer greater than 20 - 25%, or who are known or
suspected to be positive for the breast cancer gene.

BI-RADS CATEGORY  1: Negative.

## 2018-08-26 NOTE — Progress Notes (Signed)
[place]  Telephone:([phone] Fax:([phone]    ID: Erika Foster DOB: [birth year]  MR#: [id number]  XTG#:[id number]  Patient Care Team: Chesley Noon, MD as PCP - General (Family Medicine) Valetta Close, Collene Leyden, DO (Inactive) (Family Medicine) Danella Sensing, MD as Consulting Physician (Dermatology) Magrinat, Virgie Dad, MD as Consulting Physician (Oncology) Juanda Chance, NP as Nurse Practitioner (Obstetrics and Gynecology) Juanita Craver, MD as Consulting Physician (Gastroenterology) Eusebio Friendly, MD as Referring Physician (Internal Medicine) Dian Queen, MD as Consulting Physician (Obstetrics and Gynecology) Chauncey Cruel, MD OTHER MD:   CHIEF COMPLAINT: Estrogen receptor positive breast cancer  CURRENT TREATMENT: Intensified screening   BREAST CANCER HISTORY: From the earlier consult note:  "Erika Foster" was diagnosed with right-sided breast cancer at the age of 64, in 2006. She had a lumpectomy and sentinel lymph node sampling October 2006 at the [facility] in [place] for what proved to be an invasive ductal carcinoma, grade 2, estrogen and progesterone receptor positive, HER-2 not amplified. She had a micrometastases in one of the 2 sentinel lymph nodes. 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 This showed a 0.6 cm residual tumor in the breast but all 10 additional lymph nodes were clear. Adjuvantly she was treated with doxorubicin and cyclophosphamide in dose dense fashion 4 followed by weekly paclitaxel 8, completed January 2007. She then received adjuvant radiation. After that she was on Zoladex and tamoxifen for 5 years.  She had immediate implant reconstruction in 2006, but this had to be removed because of a staph infection. She then had a latissimus flap which unfortunately also was compromised by staph infection.  After moving to [facility] in 2014  the more recent data showing additional benefit from 10 years of tamoxifen compared to 5 was discussed and she resumed tamoxifen for a projected additional 5 years in 2014. She went off tamoxifen in 2016, at the 10 year anniversary of her surgery, on the advice of Dr. Lindon Romp at [facility]   Her subsequent history is as detailed below   INTERVAL HISTORY: Erika Foster returns today for follow-up and treatment of her CHEK2 associated breast cancer.   She continues with intensified screenings. Her last breast MRI was on 09/22/2017 and her last mammogram was on 02/21/2018 at [facility]. She usually sees her GYN, Dr. Helane Rima following her mammography for her annual exam and breast check up.   Since her last visit here, she has not undergone any additional studies.     REVIEW OF SYSTEMS: Erika Foster notes that she has been exercising, up to 4 times a week; she built a home gym in her garage with her husband. In addition to her home gym, she has been walking a lot as well. She continues to work from home. She is taking appropriate pandemic precautions. The patient denies unusual headaches, visual changes, nausea, vomiting, or dizziness. There has been no unusual cough, phlegm production, or pleurisy. This been no change in bowel or bladder habits. The patient denies unexplained fatigue or unexplained weight loss, bleeding, rash, or fever. A detailed review of systems was otherwise noncontributory.    PAST MEDICAL HISTORY: Past Medical History:  Diagnosis Date  . Family history of kidney cancer   . Family history of prostate cancer     PAST SURGICAL HISTORY: No past surgical history on file.  FAMILY HISTORY Family History  Problem Relation Age of Onset  . Kidney cancer Mother 86  .  Prostate cancer Father 39  . Leukemia Maternal Uncle        died in his 15s  . Cancer Maternal Grandfather        unknown form  . Lung cancer Paternal Grandfather        smoker and a Ecologist  . Breast cancer Neg  Hx    The patient's father was diagnosed with prostate cancer at age 6. The patient's mother had renal cancer at age 47 and died shortly thereafter. The patient has one brother, 2 sisters, one of whom is an M.D. There is no history of breast or ovarian, colon or uterine cancers in the family.  GYNECOLOGIC HISTORY:  No LMP recorded. Menarche age 49, first live birth age 50, the patient is Erika Foster P3. Menstrual cycles resumed after chemotherapy, and she is still having regular menstrual periods  SOCIAL HISTORY: (Updated August 2020) [place] works for [employer]. her husband Shanon Brow works for [employer]. There are 3 daughters are Shea Evans, and Piqua. The patient is a Tourist information centre manager.   ADVANCED DIRECTIVES: The patient's husband is her healthcare part of attorney   HEALTH MAINTENANCE: Social History   Tobacco Use  . Smoking status: Never Smoker  . Smokeless tobacco: Never Used  Substance Use Topics  . Alcohol use: Yes    Comment: rare  . Drug use: No     Colonoscopy: February 2018  P[facility]  [facility], with a T score of -1.2 (osteopenia)   Not on File  Current Outpatient Medications  Medication Sig Dispense Refill  . PARoxetine (PAXIL) 10 MG tablet Take 1 tablet (10 mg total) by mouth daily.     No current facility-administered medications for this visit.     OBJECTIVE: Young white woman in no acute distress  Vitals:   08/27/18 0826  BP: 124/87  Pulse: 82  Resp: 18  Temp: 98 F (36.7 C)  SpO2: 100%     Body mass index is 26.48 kg/m.      Sclerae unicteric, EOMs intact Wearing a mask No cervical or supraclavicular adenopathy Lungs no rales or rhonchi Heart regular rate and rhythm Abd soft, nontender, positive bowel sounds MSK no focal spinal tenderness, no upper extremity lymphedema Neuro: nonfocal, well oriented, appropriate affect Breasts: The right breast is status post mastectomy with latissimus reconstruction.  It is  considerably smaller than the left breast.  The left breast is status post reduction mammoplasty.  It is otherwise unremarkable.  Both axillae are benign.   LAB RESULTS:  CMP     Component Value Date/Time   NA 137 06/21/2017 1258   NA 140 06/01/2016 1409   K 3.8 06/21/2017 1258   K 4.0 06/01/2016 1409   CL 105 06/21/2017 1258   CO2 24 06/21/2017 1258   CO2 24 06/01/2016 1409   GLUCOSE 124 06/21/2017 1258   GLUCOSE 101 06/01/2016 1409   BUN 18 06/21/2017 1258   BUN 19.0 06/01/2016 1409   CREATININE 0.90 06/21/2017 1258   CREATININE 0.9 06/01/2016 1409   CALCIUM 9.2 06/21/2017 1258   CALCIUM 9.2 06/01/2016 1409   PROT 7.3 06/21/2017 1258   PROT 7.1 06/01/2016 1409   ALBUMIN 4.3 06/21/2017 1258   ALBUMIN 4.1 06/01/2016 1409   AST 18 06/21/2017 1258   AST 19 06/01/2016 1409   ALT 9 06/21/2017 1258   ALT 16 06/01/2016 1409   ALKPHOS 46 06/21/2017 1258   ALKPHOS 49 06/01/2016 1409   BILITOT  0.3 06/21/2017 1258   BILITOT 0.24 06/01/2016 1409   GFRNONAA >60 06/21/2017 1258   GFRAA >60 06/21/2017 1258    INo results found for: SPEP, UPEP  Lab Results  Component Value Date   WBC 6.5 08/27/2018   NEUTROABS 3.5 08/27/2018   HGB 14.6 08/27/2018   HCT 43.9 08/27/2018   MCV 93.8 08/27/2018   PLT 348 08/27/2018      Chemistry      Component Value Date/Time   NA 137 06/21/2017 1258   NA 140 06/01/2016 1409   K 3.8 06/21/2017 1258   K 4.0 06/01/2016 1409   CL 105 06/21/2017 1258   CO2 24 06/21/2017 1258   CO2 24 06/01/2016 1409   BUN 18 06/21/2017 1258   BUN 19.0 06/01/2016 1409   CREATININE 0.90 06/21/2017 1258   CREATININE 0.9 06/01/2016 1409      Component Value Date/Time   CALCIUM 9.2 06/21/2017 1258   CALCIUM 9.2 06/01/2016 1409   ALKPHOS 46 06/21/2017 1258   ALKPHOS 49 06/01/2016 1409   AST 18 06/21/2017 1258   AST 19 06/01/2016 1409   ALT 9 06/21/2017 1258   ALT 16 06/01/2016 1409   BILITOT 0.3 06/21/2017 1258   BILITOT 0.24 06/01/2016 1409        No results found for: LABCA2  No components found for: LABCA125  No results for input(s): INR in the last 168 hours.  Urinalysis No results found for: COLORURINE, APPEARANCEUR, LABSPEC, PHURINE, GLUCOSEU, HGBUR, BILIRUBINUR, KETONESUR, PROTEINUR, UROBILINOGEN, NITRITE, LEUKOCYTESUR   STUDIES: No results found.   ELIGIBLE FOR AVAILABLE RESEARCH PROTOCOL: No  ASSESSMENT: 48 y.o. CHEK2 positive [place] woman  (1) status post right lumpectomy and sentinel lymph node sampling in 2006 (age 75) for a pT1 pN1 (mic), stage IIA invasive ductal carcinoma, grade 2, estrogen and progesterone receptor positive, HER-2 not amplified, with positive margins  (2) status post right mastectomy and full axillary lymph node dissection 2006 showing an additional 0.6 cm of tumor, but clear margins and 0 of 10 axillary lymph nodes involved  (a) status post right breast reconstruction 2, including a latissimus flap, both failed secondary to staph infections   (3) treated adjuvantly with doxorubicin and cyclophosphamide in dose dense fashion 4 followed by paclitaxel 8  (4) completed adjuvant radiation spring of 2007  (5) received goserelin and tamoxifen for 5 years, (through 2012)  (6) resumed tamoxifen 2014, discontinuing it in 2016, for a total of 8 years of anti-estrogen therapy  (a) Is not clear that any additional antiestrogen therapy would further reduce risk  (7) genetics testing 01/15/2016 through the Comprehensive Cancer Panel offered by [facility] identified a single, heterozygous pathogenic gene mutation called CHEK2, c.1100delC. There were no deleterious mutations in APC, ATM, AXIN2, BAP1, BARD1, BMPR1A, BRCA1, BRCA2, BRIP1, CDH1, CDK4, CDKN2A, CHEK2, EPCAM, FANCC, FH, FLCN, HOXB13, MET, MITF, MLH1, MSH2, MSH6, MUTYH, NBN, NF1, NTHL1, PALB2, PMS2, POLD1, POLE, POT1, PTEN, RAD51C, RAD51D, RECQL, SCG5/GREM1, SDHB, SDHC, SDHD, SMAD4, STK11, TP53, TSC!, TSC2 and VHL..  (a) will need  yearly MRI of the breast in addition to yearly mammography  (b) colonoscopy 03/01/2016 (repeat every 5 years)    PLAN: Laurence Aly is now 14 years out from definitive surgery for her breast cancer with no evidence of disease recurrence.  This is very favorable.  She is at high risk for disease recurrence given her genetics and accordingly she is undergoing intensified screening.  She is due for breast MRI in September.  She will  have her screening mammography in March and she will see her gynecologist Dr. Tressia Danas shortly after that.  She will see me again late September of next year, after her next years breast MRI  I commended her on her exercise program and COVID precautions  She knows to call for any other issue that may develop before the next visit.      Magrinat, Virgie Dad, MD  08/27/18 8:49 AM Medical Oncology and Hematology [facility] [address] [place], [place] [address] Tel. [phone]    Fax. [phone]  I, Jacqualyn Posey am acting as a Education administrator for Chauncey Cruel, MD.   I, Lurline Del MD, have reviewed the above documentation for accuracy and completeness, and I agree with the above.

## 2018-08-27 ENCOUNTER — Inpatient Hospital Stay (HOSPITAL_BASED_OUTPATIENT_CLINIC_OR_DEPARTMENT_OTHER): Payer: BC Managed Care – PPO | Admitting: Oncology

## 2018-08-27 ENCOUNTER — Inpatient Hospital Stay: Payer: BC Managed Care – PPO | Attending: Family Medicine

## 2018-08-27 ENCOUNTER — Other Ambulatory Visit: Payer: Self-pay

## 2018-08-27 VITALS — BP 124/87 | HR 82 | Temp 98.0°F | Resp 18 | Ht 65.5 in | Wt 161.6 lb

## 2018-08-27 DIAGNOSIS — Z853 Personal history of malignant neoplasm of breast: Secondary | ICD-10-CM | POA: Insufficient documentation

## 2018-08-27 DIAGNOSIS — Z833 Family history of diabetes mellitus: Secondary | ICD-10-CM | POA: Diagnosis not present

## 2018-08-27 DIAGNOSIS — Z806 Family history of leukemia: Secondary | ICD-10-CM | POA: Insufficient documentation

## 2018-08-27 DIAGNOSIS — Z923 Personal history of irradiation: Secondary | ICD-10-CM | POA: Insufficient documentation

## 2018-08-27 DIAGNOSIS — Z8051 Family history of malignant neoplasm of kidney: Secondary | ICD-10-CM | POA: Insufficient documentation

## 2018-08-27 DIAGNOSIS — Z8042 Family history of malignant neoplasm of prostate: Secondary | ICD-10-CM | POA: Diagnosis not present

## 2018-08-27 DIAGNOSIS — Z79899 Other long term (current) drug therapy: Secondary | ICD-10-CM | POA: Diagnosis not present

## 2018-08-27 DIAGNOSIS — Z801 Family history of malignant neoplasm of trachea, bronchus and lung: Secondary | ICD-10-CM | POA: Insufficient documentation

## 2018-08-27 DIAGNOSIS — Z9221 Personal history of antineoplastic chemotherapy: Secondary | ICD-10-CM | POA: Insufficient documentation

## 2018-08-27 DIAGNOSIS — C50811 Malignant neoplasm of overlapping sites of right female breast: Secondary | ICD-10-CM

## 2018-08-27 DIAGNOSIS — Z1589 Genetic susceptibility to other disease: Secondary | ICD-10-CM

## 2018-08-27 DIAGNOSIS — Z1502 Genetic susceptibility to malignant neoplasm of ovary: Secondary | ICD-10-CM | POA: Diagnosis not present

## 2018-08-27 DIAGNOSIS — Z170 Estrogen receptor positive status [ER+]: Secondary | ICD-10-CM | POA: Diagnosis not present

## 2018-08-27 DIAGNOSIS — C50919 Malignant neoplasm of unspecified site of unspecified female breast: Secondary | ICD-10-CM

## 2018-08-27 DIAGNOSIS — Z1509 Genetic susceptibility to other malignant neoplasm: Secondary | ICD-10-CM

## 2018-08-27 LAB — COMPREHENSIVE METABOLIC PANEL
ALT: 15 U/L (ref 0–44)
AST: 17 U/L (ref 15–41)
Albumin: 4.2 g/dL (ref 3.5–5.0)
Alkaline Phosphatase: 47 U/L (ref 38–126)
Anion gap: 10 (ref 5–15)
BUN: 15 mg/dL (ref 6–20)
CO2: 23 mmol/L (ref 22–32)
Calcium: 8.9 mg/dL (ref 8.9–10.3)
Chloride: 105 mmol/L (ref 98–111)
Creatinine, Ser: 0.88 mg/dL (ref 0.44–1.00)
GFR calc Af Amer: >60 mL/min (ref >60)
GFR calc non Af Amer: >60 mL/min (ref >60)
Glucose, Bld: 102 mg/dL — ABNORMAL HIGH (ref 70–99)
Potassium: 3.9 mmol/L (ref 3.5–5.1)
Sodium: 138 mmol/L (ref 135–145)
Total Bilirubin: 0.5 mg/dL (ref 0.3–1.2)
Total Protein: 7.2 g/dL (ref 6.5–8.1)

## 2018-08-27 LAB — CBC WITH DIFFERENTIAL/PLATELET
Abs Immature Granulocytes: 0.02 10*3/uL (ref 0.00–0.07)
Basophils Absolute: 0 10*3/uL (ref 0.0–0.1)
Basophils Relative: 1 %
Eosinophils Absolute: 0.2 10*3/uL (ref 0.0–0.5)
Eosinophils Relative: 3 %
HCT: 43.9 % (ref 36.0–46.0)
Hemoglobin: 14.6 g/dL (ref 12.0–15.0)
Immature Granulocytes: 0 %
Lymphocytes Relative: 34 %
Lymphs Abs: 2.2 10*3/uL (ref 0.7–4.0)
MCH: 31.2 pg (ref 26.0–34.0)
MCHC: 33.3 g/dL (ref 30.0–36.0)
MCV: 93.8 fL (ref 80.0–100.0)
Monocytes Absolute: 0.5 10*3/uL (ref 0.1–1.0)
Monocytes Relative: 8 %
Neutro Abs: 3.5 10*3/uL (ref 1.7–7.7)
Neutrophils Relative %: 54 %
Platelets: 348 10*3/uL (ref 150–400)
RBC: 4.68 MIL/uL (ref 3.87–5.11)
RDW: 12.4 % (ref 11.5–15.5)
WBC: 6.5 10*3/uL (ref 4.0–10.5)
nRBC: 0 % (ref 0.0–0.2)

## 2018-09-11 ENCOUNTER — Encounter: Payer: Self-pay | Admitting: Oncology

## 2018-10-09 ENCOUNTER — Telehealth: Payer: Self-pay | Admitting: *Deleted

## 2018-10-09 NOTE — Telephone Encounter (Signed)
This RN spoke with pt per her VM stating she is waiting for a call to schedule MRI of her breast .  Note MD placed order on 08/27/2018 with expected date for 09/17/2018.   This RN sent note to PA dept per contact by pt via Mychart inquiring about when MRI will be scheduled - which it was not yet authorized.  Noted authorized on 09/12/2018 with expiration date for 10/11/2018.  This RN informed pt of above as well as unsure why she was not contacted by scheduling.  Plan is need for extension of PA due to expiration date of 10/11/2018 to be sent to PA dept and then this RN will follow up on scheduling.

## 2018-10-11 ENCOUNTER — Telehealth: Payer: Self-pay | Admitting: *Deleted

## 2018-10-11 NOTE — Telephone Encounter (Signed)
This RN spoke with [place] per MRI of breast that was not scheduled- then authorization expired. Authorization has now been extended and need to schedule ASAP.  Due to pt having current menstrual cycles - [facility] will call pt to coordinate appropriately.  This RN will follow up per prior discussion with pt.

## 2018-10-30 ENCOUNTER — Ambulatory Visit
Admission: RE | Admit: 2018-10-30 | Discharge: 2018-10-30 | Disposition: A | Discharge status: Home / Self Care | Payer: BC Managed Care – PPO | Source: Ambulatory Visit | Attending: Oncology | Admitting: Oncology

## 2018-10-30 ENCOUNTER — Other Ambulatory Visit: Payer: Self-pay

## 2018-10-30 DIAGNOSIS — C50919 Malignant neoplasm of unspecified site of unspecified female breast: Secondary | ICD-10-CM

## 2018-10-30 DIAGNOSIS — C50811 Malignant neoplasm of overlapping sites of right female breast: Secondary | ICD-10-CM

## 2018-10-30 DIAGNOSIS — Z170 Estrogen receptor positive status [ER+]: Secondary | ICD-10-CM

## 2018-10-30 DIAGNOSIS — Z1509 Genetic susceptibility to other malignant neoplasm: Secondary | ICD-10-CM

## 2018-10-30 MED ORDER — GADOBUTROL 1 MMOL/ML IV SOLN
7.0000 mL | Freq: Once | INTRAVENOUS | Status: AC | PRN
  Administered 2018-10-30: 7 mL via INTRAVENOUS

## 2018-11-01 ENCOUNTER — Encounter: Payer: Self-pay | Admitting: Oncology

## 2018-12-12 ENCOUNTER — Telehealth: Payer: Self-pay | Admitting: *Deleted

## 2018-12-12 NOTE — Telephone Encounter (Signed)
Records faxed to [facility] - release [id number]

## 2019-01-21 ENCOUNTER — Other Ambulatory Visit: Payer: Self-pay | Admitting: Oncology

## 2019-01-21 DIAGNOSIS — Z1231 Encounter for screening mammogram for malignant neoplasm of breast: Secondary | ICD-10-CM

## 2019-05-01 ENCOUNTER — Ambulatory Visit
Admission: RE | Admit: 2019-05-01 | Discharge: 2019-05-01 | Disposition: A | Discharge status: Home / Self Care | Payer: BC Managed Care – PPO | Source: Ambulatory Visit | Attending: Oncology | Admitting: Oncology

## 2019-05-01 ENCOUNTER — Other Ambulatory Visit: Payer: Self-pay

## 2019-05-01 DIAGNOSIS — Z1231 Encounter for screening mammogram for malignant neoplasm of breast: Secondary | ICD-10-CM

## 2019-09-29 NOTE — Progress Notes (Signed)
[place]  Telephone:([phone]) [phone] [phone]    ID: Erika Foster DOB: 1970-11-10  MR#: [id number]  VFI#:[id number]  Patient Care Team: Chesley Noon, MD as PCP - General (Family Medicine) Valetta Close, Collene Leyden, DO (Inactive) (Family Medicine) Danella Sensing, MD as Consulting Physician (Dermatology) Elane Peabody, Virgie Dad, MD as Consulting Physician (Oncology) Juanda Chance, NP as Nurse Practitioner (Obstetrics Erika Gynecology) Eusebio Friendly, MD as Referring Physician (Internal Medicine) Dian Queen, MD as Consulting Physician (Obstetrics Erika Gynecology) Carol Ada, MD as Consulting Physician (Gastroenterology) Chauncey Cruel, MD OTHER MD:   CHIEF COMPLAINT: Estrogen receptor positive breast cancer (s/p right mastectomy)  CURRENT TREATMENT: Intensified screening   INTERVAL HISTORY: Erika Foster returns today for follow-up of her CHEK2 associated breast cancer.   Since her last visit, she underwent breast MRI on 10/30/2018 showing: breast composition B; no evidence of malignancy.  She also underwent left screening mammography with tomography at [facility] on 05/01/2019 showing: breast density category B; no evidence of malignancy.   REVIEW OF SYSTEMS: Erika Foster had the Ute vaccine x2 with no complications.  She exercises by doing CrossFit 4 days a week Erika walking the other days.  She is now working for [employer] Erika enjoying it.  A detailed review of systems today was otherwise entirely benign   BREAST CANCER HISTORY: From the earlier consult note:  "Erika Foster" was diagnosed with right-sided breast cancer at the age of 82, in 2006. She had a lumpectomy Erika sentinel lymph node sampling October 2006 at the [facility] in [place] for what proved to be an invasive ductal carcinoma, grade 2, estrogen Erika progesterone receptor positive, HER-2 not amplified. She had a micrometastases in one of the 2 sentinel lymph nodes. Margins were positive for  ductal carcinoma in situ so she proceeded to right mastectomy Erika full axillary lymph node dissection. This showed a 0.6 cm residual tumor in the breast but all 10 additional lymph nodes were clear. Adjuvantly she was treated with doxorubicin Erika cyclophosphamide in dose dense fashion 4 followed by weekly paclitaxel 8, completed January 2007. She then received adjuvant radiation. After that she was on Zoladex Erika tamoxifen for 5 years.  She had immediate implant reconstruction in 2006, but this had to be removed because of a staph infection. She then had a latissimus flap which unfortunately also was compromised by staph infection.  After moving to [facility] in 2014 the more recent data showing additional benefit from 10 years of tamoxifen compared to 5 was discussed Erika she resumed tamoxifen for a projected additional 5 years in 2014. She went off tamoxifen in 2016, at the 10 year anniversary of her surgery, on the advice of Dr. Lindon Romp at [facility]   Her subsequent history is as detailed below   PAST MEDICAL HISTORY: Past Medical History:  Diagnosis Date  . Family history of kidney cancer   . Family history of prostate cancer     PAST SURGICAL HISTORY: No past surgical history on file.    FAMILY HISTORY Family History  Problem Relation Age of Onset  . Kidney cancer Mother 7  . Prostate cancer Father 46  . Leukemia Maternal Uncle        died in his 64s  . Cancer Maternal Grandfather        unknown form  . Lung cancer Paternal Grandfather        smoker Erika a Ecologist  . Breast cancer Neg Hx    The patient's father was diagnosed  with prostate cancer at age 28. The patient's mother had renal cancer at age 78 Erika died shortly thereafter. The patient has one brother, 2 sisters, one of whom is an M.D. There is no history of breast or ovarian, colon or uterine cancers in the family.   GYNECOLOGIC HISTORY:  No LMP recorded. Menarche age 70, first live birth age 50, the patient  is [place] P3. Menstrual cycles resumed after chemotherapy, Erika she is still having regular menstrual periods   SOCIAL HISTORY: (Updated September 2021) [place] works for [employer]. Her husband Erika Foster works for [employer]. There are 3 daughters are Erika Foster, Erika Foster.  Oldest daughter is a Paramedic in Risk manager.  Second daughter is a Equities trader in high school thinking of going to [employer] in [employer] school.  Third daughter is 25 years old.  The patient is a Tourist information centre manager.   ADVANCED DIRECTIVES: The patient's husband is her healthcare part of attorney   HEALTH MAINTENANCE: Social History   Tobacco Use  . Smoking status: Never Smoker  . Smokeless tobacco: Never Used  Substance Use Topics  . Alcohol use: Yes    Comment: rare  . Drug use: No     Colonoscopy: February 2018  PAP:  [facility]/01/2015, with a T score of -1.2 (osteopenia)   Not on File  No current outpatient medications on file.   No current facility-administered medications for this visit.    OBJECTIVE: White woman who appears well  Vitals:   09/30/19 0853  BP: 125/77  Pulse: 79  Resp: 20  Temp: (!) 97.3 F (36.3 C)  SpO2: 100%     Body mass index is 27.76 kg/m.     Filed Weights   09/30/19 0853  Weight: 169 lb 6.4 oz (76.8 kg)    Sclerae unicteric, EOMs intact Wearing a mask No cervical or supraclavicular adenopathy Lungs no rales or rhonchi Heart regular rate Erika rhythm Abd soft, nontender, positive bowel sounds MSK no focal spinal tenderness, no upper extremity lymphedema Neuro: nonfocal, well oriented, appropriate affect Breasts: The right breast is status post mastectomy Erika failed reconstruction.  On the skin there are some bumpy mildly erythematous findings which the patient tells me are reaction to her prosthesis Erika that when she does not wear the prosthesis the disappear.  The left breast is status post reduction mammoplasty.  Both axillae are  benign.   LAB RESULTS:  CMP     Component Value Date/Time   NA 138 09/30/2019 0840   NA 140 06/01/2016 1409   K 4.5 09/30/2019 0840   K 4.0 06/01/2016 1409   CL 105 09/30/2019 0840   CO2 28 09/30/2019 0840   CO2 24 06/01/2016 1409   GLUCOSE 82 09/30/2019 0840   GLUCOSE 101 06/01/2016 1409   BUN 14 09/30/2019 0840   BUN 19.0 06/01/2016 1409   CREATININE 0.80 09/30/2019 0840   CREATININE 0.90 06/21/2017 1258   CREATININE 0.9 06/01/2016 1409   CALCIUM 9.4 09/30/2019 0840   CALCIUM 9.2 06/01/2016 1409   PROT 7.2 09/30/2019 0840   PROT 7.1 06/01/2016 1409   ALBUMIN 4.0 09/30/2019 0840   ALBUMIN 4.1 06/01/2016 1409   AST 19 09/30/2019 0840   AST 18 06/21/2017 1258   AST 19 06/01/2016 1409   ALT 17 09/30/2019 0840   ALT 9 06/21/2017 1258   ALT 16 06/01/2016 1409   ALKPHOS 46 09/30/2019 0840   ALKPHOS 49 06/01/2016  1409   BILITOT 0.3 09/30/2019 0840   BILITOT 0.3 06/21/2017 1258   BILITOT 0.24 06/01/2016 1409   GFRNONAA >60 09/30/2019 0840   GFRNONAA >60 06/21/2017 1258   GFRAA >60 09/30/2019 0840   GFRAA >60 06/21/2017 1258    INo results found for: SPEP, UPEP  Lab Results  Component Value Date   WBC 6.6 09/30/2019   NEUTROABS 3.3 09/30/2019   HGB 14.4 09/30/2019   HCT 42.4 09/30/2019   MCV 93.2 09/30/2019   PLT 331 09/30/2019      Chemistry      Component Value Date/Time   NA 138 09/30/2019 0840   NA 140 06/01/2016 1409   K 4.5 09/30/2019 0840   K 4.0 06/01/2016 1409   CL 105 09/30/2019 0840   CO2 28 09/30/2019 0840   CO2 24 06/01/2016 1409   BUN 14 09/30/2019 0840   BUN 19.0 06/01/2016 1409   CREATININE 0.80 09/30/2019 0840   CREATININE 0.90 06/21/2017 1258   CREATININE 0.9 06/01/2016 1409      Component Value Date/Time   CALCIUM 9.4 09/30/2019 0840   CALCIUM 9.2 06/01/2016 1409   ALKPHOS 46 09/30/2019 0840   ALKPHOS 49 06/01/2016 1409   AST 19 09/30/2019 0840   AST 18 06/21/2017 1258   AST 19 06/01/2016 1409   ALT 17 09/30/2019 0840   ALT  9 06/21/2017 1258   ALT 16 06/01/2016 1409   BILITOT 0.3 09/30/2019 0840   BILITOT 0.3 06/21/2017 1258   BILITOT 0.24 06/01/2016 1409       No results found for: LABCA2  No components found for: LABCA125  No results for input(s): INR in the last 168 hours.  Urinalysis No results found for: COLORURINE, APPEARANCEUR, LABSPEC, PHURINE, GLUCOSEU, HGBUR, BILIRUBINUR, KETONESUR, PROTEINUR, UROBILINOGEN, NITRITE, LEUKOCYTESUR   STUDIES: No results found.   ELIGIBLE FOR AVAILABLE RESEARCH PROTOCOL: No  ASSESSMENT: 49 y.o. CHEK2 positive [place] woman  (1) status post right lumpectomy Erika sentinel lymph node sampling in 2006 (age 69) for a pT1 pN1 (mic), stage IIA invasive ductal carcinoma, grade 2, estrogen Erika progesterone receptor positive, HER-2 not amplified, with positive margins  (2) status post right mastectomy Erika full axillary lymph node dissection 2006 showing an additional 0.6 cm of tumor, but clear margins Erika 0 of 10 axillary lymph nodes involved  (a) status post right breast reconstruction 2, including a latissimus flap, both failed secondary to staph infections   (3) treated adjuvantly with doxorubicin Erika cyclophosphamide in dose dense fashion 4 followed by paclitaxel 8  (4) completed adjuvant radiation spring of 2007  (5) received goserelin Erika tamoxifen for 5 years, (through 2012)  (6) resumed tamoxifen 2014, discontinuing it in 2016, for a total of 8 years of anti-estrogen therapy  (a) Is not clear that any additional antiestrogen therapy would further reduce risk  (7) genetics testing 01/15/2016 through the Comprehensive Cancer Panel offered by [facility] identified a single, heterozygous pathogenic gene mutation called CHEK2, c.1100delC. There were no deleterious mutations in APC, ATM, AXIN2, BAP1, BARD1, BMPR1A, BRCA1, BRCA2, BRIP1, CDH1, CDK4, CDKN2A, CHEK2, EPCAM, FANCC, FH, FLCN, HOXB13, MET, MITF, MLH1, MSH2, MSH6, MUTYH, NBN, NF1, NTHL1,  PALB2, PMS2, POLD1, POLE, POT1, PTEN, RAD51C, RAD51D, RECQL, SCG5/GREM1, SDHB, SDHC, SDHD, SMAD4, STK11, TP53, TSC!, TSC2 Erika [facility]..  (a) will need yearly MRI of the breast in addition to yearly mammography  (b) colonoscopy 03/01/2016 [facility]; repeat every 5 years)   PLAN: Laurence Aly is now 15 years out from definitive surgery  for her breast cancer with no evidence of disease recurrence.  This is very favorable.  She undergoes intensified screening because of her CHEK2 mutation.  She will have a repeat breast MRI this year.  Last year the MRI cost her more than $1000.  We discussed the alternative abbreviated MRI Erika she prefers that option.  I am comfortable with that.  She is getting "tired" of all the screening tests Erika wishes she had had bilateral mastectomies originally.  We discussed the possibility of her having bilateral mastectomies again with D IEP reconstruction.  She really does not want to go that route.  She had colonoscopy February 2018 Erika it will need to be repeated approximately 5 years from that date  She will have her MRI in October, mammography in April Erika see me again a year from now.  She knows to call for any other issue that may develop before the next visit  Total encounter time 25 minutes.*    Jamerica Snavely, Virgie Dad, MD  09/30/19 9:22 AM Medical Oncology Erika Hematology [facility] [place], [place] [address] Tel. [phone]    Fax. [phone]   I, Wilburn Mylar, am acting as scribe for Dr. Virgie Dad. Bella Brummet.  I, Lurline Del MD, have reviewed the above documentation for accuracy Erika completeness, Erika I agree with the above.    *Total Encounter Time as defined by the Centers for Medicare [place] Medicaid Services includes, in addition to the face-to-face time of a patient visit (documented in the note above) non-face-to-face time: obtaining Erika reviewing outside history, ordering Erika reviewing medications, tests or procedures,  care coordination (communications with other health care professionals or caregivers) Erika documentation in the medical record.

## 2019-09-30 ENCOUNTER — Other Ambulatory Visit: Payer: Self-pay

## 2019-09-30 ENCOUNTER — Inpatient Hospital Stay: Payer: BC Managed Care – PPO

## 2019-09-30 ENCOUNTER — Inpatient Hospital Stay: Payer: BC Managed Care – PPO | Attending: Oncology | Admitting: Oncology

## 2019-09-30 VITALS — BP 125/77 | HR 79 | Temp 97.3°F | Resp 20 | Ht 65.5 in | Wt 169.4 lb

## 2019-09-30 DIAGNOSIS — Z08 Encounter for follow-up examination after completed treatment for malignant neoplasm: Secondary | ICD-10-CM | POA: Diagnosis not present

## 2019-09-30 DIAGNOSIS — C50811 Malignant neoplasm of overlapping sites of right female breast: Secondary | ICD-10-CM

## 2019-09-30 DIAGNOSIS — Z9011 Acquired absence of right breast and nipple: Secondary | ICD-10-CM | POA: Insufficient documentation

## 2019-09-30 DIAGNOSIS — Z1502 Genetic susceptibility to malignant neoplasm of ovary: Secondary | ICD-10-CM

## 2019-09-30 DIAGNOSIS — C50919 Malignant neoplasm of unspecified site of unspecified female breast: Secondary | ICD-10-CM

## 2019-09-30 DIAGNOSIS — Z170 Estrogen receptor positive status [ER+]: Secondary | ICD-10-CM | POA: Diagnosis not present

## 2019-09-30 DIAGNOSIS — Z853 Personal history of malignant neoplasm of breast: Secondary | ICD-10-CM | POA: Insufficient documentation

## 2019-09-30 DIAGNOSIS — Z1589 Genetic susceptibility to other disease: Secondary | ICD-10-CM

## 2019-09-30 DIAGNOSIS — Z1509 Genetic susceptibility to other malignant neoplasm: Secondary | ICD-10-CM

## 2019-09-30 LAB — COMPREHENSIVE METABOLIC PANEL
ALT: 17 U/L (ref 0–44)
AST: 19 U/L (ref 15–41)
Albumin: 4 g/dL (ref 3.5–5.0)
Alkaline Phosphatase: 46 U/L (ref 38–126)
Anion gap: 5 (ref 5–15)
BUN: 14 mg/dL (ref 6–20)
CO2: 28 mmol/L (ref 22–32)
Calcium: 9.4 mg/dL (ref 8.9–10.3)
Chloride: 105 mmol/L (ref 98–111)
Creatinine, Ser: 0.8 mg/dL (ref 0.44–1.00)
GFR calc Af Amer: >60 mL/min (ref >60)
GFR calc non Af Amer: >60 mL/min (ref >60)
Glucose, Bld: 82 mg/dL (ref 70–99)
Potassium: 4.5 mmol/L (ref 3.5–5.1)
Sodium: 138 mmol/L (ref 135–145)
Total Bilirubin: 0.3 mg/dL (ref 0.3–1.2)
Total Protein: 7.2 g/dL (ref 6.5–8.1)

## 2019-09-30 LAB — CBC WITH DIFFERENTIAL/PLATELET
Abs Immature Granulocytes: 0.01 10*3/uL (ref 0.00–0.07)
Basophils Absolute: 0 10*3/uL (ref 0.0–0.1)
Basophils Relative: 1 %
Eosinophils Absolute: 0.3 10*3/uL (ref 0.0–0.5)
Eosinophils Relative: 4 %
HCT: 42.4 % (ref 36.0–46.0)
Hemoglobin: 14.4 g/dL (ref 12.0–15.0)
Immature Granulocytes: 0 %
Lymphocytes Relative: 38 %
Lymphs Abs: 2.5 10*3/uL (ref 0.7–4.0)
MCH: 31.6 pg (ref 26.0–34.0)
MCHC: 34 g/dL (ref 30.0–36.0)
MCV: 93.2 fL (ref 80.0–100.0)
Monocytes Absolute: 0.5 10*3/uL (ref 0.1–1.0)
Monocytes Relative: 7 %
Neutro Abs: 3.3 10*3/uL (ref 1.7–7.7)
Neutrophils Relative %: 50 %
Platelets: 331 10*3/uL (ref 150–400)
RBC: 4.55 MIL/uL (ref 3.87–5.11)
RDW: 12.5 % (ref 11.5–15.5)
WBC: 6.6 10*3/uL (ref 4.0–10.5)
nRBC: 0 % (ref 0.0–0.2)

## 2019-10-16 ENCOUNTER — Other Ambulatory Visit: Payer: Self-pay

## 2019-10-16 ENCOUNTER — Ambulatory Visit
Admission: RE | Admit: 2019-10-16 | Discharge: 2019-10-16 | Disposition: A | Discharge status: Home / Self Care | Payer: No Typology Code available for payment source | Source: Ambulatory Visit | Attending: Oncology | Admitting: Oncology

## 2019-10-16 DIAGNOSIS — Z170 Estrogen receptor positive status [ER+]: Secondary | ICD-10-CM

## 2019-10-16 DIAGNOSIS — Z1589 Genetic susceptibility to other disease: Secondary | ICD-10-CM

## 2019-10-16 MED ORDER — GADOBUTROL 1 MMOL/ML IV SOLN
8.0000 mL | Freq: Once | INTRAVENOUS | Status: AC | PRN
  Administered 2019-10-16: 8 mL via INTRAVENOUS

## 2019-11-06 ENCOUNTER — Other Ambulatory Visit: Payer: Self-pay | Admitting: Oncology

## 2019-11-06 NOTE — Progress Notes (Signed)
We received a note from Dr. Lorie Apley office that the patient never showed and was never rescheduled.  Dr. Collene Mares has never seen her.  I removed Dr. Collene Mares from the patient's doctors list.

## 2020-03-17 ENCOUNTER — Other Ambulatory Visit: Payer: Self-pay | Admitting: Oncology

## 2020-03-17 DIAGNOSIS — Z1231 Encounter for screening mammogram for malignant neoplasm of breast: Secondary | ICD-10-CM

## 2020-05-13 ENCOUNTER — Other Ambulatory Visit: Payer: Self-pay | Admitting: Oncology

## 2020-05-13 ENCOUNTER — Other Ambulatory Visit: Payer: Self-pay

## 2020-05-13 ENCOUNTER — Ambulatory Visit
Admission: RE | Admit: 2020-05-13 | Discharge: 2020-05-13 | Disposition: A | Discharge status: Home / Self Care | Payer: BC Managed Care – PPO | Source: Ambulatory Visit | Attending: Oncology | Admitting: Oncology

## 2020-05-13 DIAGNOSIS — Z1231 Encounter for screening mammogram for malignant neoplasm of breast: Secondary | ICD-10-CM

## 2020-05-13 HISTORY — DX: Malignant neoplasm of unspecified site of unspecified female breast: C50.919

## 2020-09-29 ENCOUNTER — Inpatient Hospital Stay: Payer: BC Managed Care – PPO | Admitting: Oncology

## 2020-09-29 ENCOUNTER — Inpatient Hospital Stay: Payer: BC Managed Care – PPO

## 2020-12-22 ENCOUNTER — Inpatient Hospital Stay: Payer: BC Managed Care – PPO

## 2020-12-22 ENCOUNTER — Inpatient Hospital Stay: Payer: BC Managed Care – PPO | Admitting: Oncology

## 2021-01-26 ENCOUNTER — Other Ambulatory Visit: Payer: Self-pay

## 2021-01-26 DIAGNOSIS — C50811 Malignant neoplasm of overlapping sites of right female breast: Secondary | ICD-10-CM

## 2021-01-26 DIAGNOSIS — Z170 Estrogen receptor positive status [ER+]: Secondary | ICD-10-CM

## 2021-01-27 ENCOUNTER — Inpatient Hospital Stay: Payer: BC Managed Care – PPO

## 2021-01-27 ENCOUNTER — Inpatient Hospital Stay: Payer: BC Managed Care – PPO | Admitting: Adult Health

## 2021-02-10 ENCOUNTER — Inpatient Hospital Stay: Payer: BC Managed Care – PPO | Attending: Adult Health

## 2021-02-10 ENCOUNTER — Encounter: Payer: Self-pay | Admitting: Adult Health

## 2021-02-10 ENCOUNTER — Inpatient Hospital Stay (HOSPITAL_BASED_OUTPATIENT_CLINIC_OR_DEPARTMENT_OTHER): Payer: BC Managed Care – PPO | Admitting: Adult Health

## 2021-02-10 ENCOUNTER — Other Ambulatory Visit: Payer: Self-pay

## 2021-02-10 VITALS — BP 134/91 | HR 87 | Temp 98.5°F | Resp 16 | Ht 65.0 in | Wt 177.6 lb

## 2021-02-10 DIAGNOSIS — Z1502 Genetic susceptibility to malignant neoplasm of ovary: Secondary | ICD-10-CM | POA: Diagnosis not present

## 2021-02-10 DIAGNOSIS — C50811 Malignant neoplasm of overlapping sites of right female breast: Secondary | ICD-10-CM

## 2021-02-10 DIAGNOSIS — Z170 Estrogen receptor positive status [ER+]: Secondary | ICD-10-CM | POA: Diagnosis not present

## 2021-02-10 DIAGNOSIS — Z853 Personal history of malignant neoplasm of breast: Secondary | ICD-10-CM | POA: Diagnosis not present

## 2021-02-10 DIAGNOSIS — C50919 Malignant neoplasm of unspecified site of unspecified female breast: Secondary | ICD-10-CM | POA: Diagnosis not present

## 2021-02-10 DIAGNOSIS — Z1501 Genetic susceptibility to malignant neoplasm of breast: Secondary | ICD-10-CM | POA: Insufficient documentation

## 2021-02-10 DIAGNOSIS — Z1589 Genetic susceptibility to other disease: Secondary | ICD-10-CM

## 2021-02-10 DIAGNOSIS — Z9221 Personal history of antineoplastic chemotherapy: Secondary | ICD-10-CM | POA: Diagnosis not present

## 2021-02-10 DIAGNOSIS — Z9011 Acquired absence of right breast and nipple: Secondary | ICD-10-CM | POA: Diagnosis not present

## 2021-02-10 DIAGNOSIS — Z1509 Genetic susceptibility to other malignant neoplasm: Secondary | ICD-10-CM

## 2021-02-10 LAB — CBC WITH DIFFERENTIAL (CANCER CENTER ONLY)
Abs Immature Granulocytes: 0.01 10*3/uL (ref 0.00–0.07)
Basophils Absolute: 0 10*3/uL (ref 0.0–0.1)
Basophils Relative: 1 %
Eosinophils Absolute: 0.2 10*3/uL (ref 0.0–0.5)
Eosinophils Relative: 2 %
HCT: 42.2 % (ref 36.0–46.0)
Hemoglobin: 14.3 g/dL (ref 12.0–15.0)
Immature Granulocytes: 0 %
Lymphocytes Relative: 33 %
Lymphs Abs: 2.3 10*3/uL (ref 0.7–4.0)
MCH: 31 pg (ref 26.0–34.0)
MCHC: 33.9 g/dL (ref 30.0–36.0)
MCV: 91.5 fL (ref 80.0–100.0)
Monocytes Absolute: 0.5 10*3/uL (ref 0.1–1.0)
Monocytes Relative: 6 %
Neutro Abs: 4.2 10*3/uL (ref 1.7–7.7)
Neutrophils Relative %: 58 %
Platelet Count: 359 10*3/uL (ref 150–400)
RBC: 4.61 MIL/uL (ref 3.87–5.11)
RDW: 12.5 % (ref 11.5–15.5)
WBC Count: 7.2 10*3/uL (ref 4.0–10.5)
nRBC: 0 % (ref 0.0–0.2)

## 2021-02-10 LAB — CMP (CANCER CENTER ONLY)
ALT: 18 U/L (ref 0–44)
AST: 19 U/L (ref 15–41)
Albumin: 4.5 g/dL (ref 3.5–5.0)
Alkaline Phosphatase: 49 U/L (ref 38–126)
Anion gap: 5 (ref 5–15)
BUN: 13 mg/dL (ref 6–20)
CO2: 28 mmol/L (ref 22–32)
Calcium: 9.3 mg/dL (ref 8.9–10.3)
Chloride: 103 mmol/L (ref 98–111)
Creatinine: 0.79 mg/dL (ref 0.44–1.00)
GFR, Estimated: >60 mL/min (ref >60)
Glucose, Bld: 105 mg/dL — ABNORMAL HIGH (ref 70–99)
Potassium: 3.7 mmol/L (ref 3.5–5.1)
Sodium: 136 mmol/L (ref 135–145)
Total Bilirubin: 0.4 mg/dL (ref 0.3–1.2)
Total Protein: 7.5 g/dL (ref 6.5–8.1)

## 2021-02-10 NOTE — Assessment & Plan Note (Signed)
Erika Foster is a 51 year old woman who is status post estrogen positive breast cancer diagnosed in 2006 who received treatment with surgery, chemotherapy, antiestrogen therapy which she has completed.  She is here today for follow-up and surveillance.  1.  History of right estrogen positive breast cancer: She has no clinical or radiographic sign of breast cancer recurrence.  2.  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  She had not had this completed since October 2021.  I placed orders for this to be completed in the next month.  She is due for mammogram in May 2023.  She plans on scheduling her repeat colonoscopy.  Mkayla will return in 1 year for continued observation.  I reviewed with her Dr. Chryl Heck who is stepping in and Dr. Virgie Dad place.  Also gave Erika Foster information about breast awareness in her after visit summary.

## 2021-02-10 NOTE — Patient Instructions (Signed)
Continue with mammograms every May.  I placed orders for your breast MRI with [facility].    Breast Self-Awareness Breast self-awareness means being familiar with how your breasts look and feel. It involves checking your breasts regularly and reporting any changes to your health care provider. Practicing breast self-awareness is important. Sometimes changes may not be harmful (are benign), but sometimes a change in your breasts can be a sign of a serious medical problem. It is important to learn how to do this procedure correctly so that you can catch problems early, when treatment is more likely to be successful. All women should practice breast self-awareness, including women who have had breast implants. What you need: A mirror. A well-lit room. How to do a breast self-exam A breast self-exam is one way to learn what is normal for your breasts and whether your breasts are changing. To do a breast self-exam: Look for changes  Remove all the clothing above your waist. Stand in front of a mirror in a room with good lighting. Put your hands on your hips. Push your hands firmly downward. Compare your breasts in the mirror. Look for differences between them (asymmetry), such as: Differences in shape. Differences in size. Puckers, dips, and bumps in one breast and not the other. Look at each breast for changes in the skin, such as: Redness. Scaly areas. Look for changes in your nipples, such as: Discharge. Bleeding. Dimpling. Redness. A change in position. Feel for changes Carefully feel your breasts for lumps and changes. It is best to do this while lying on your back on the floor, and again while sitting or standing in the tub or shower with soapy water on your skin. Feel each breast in the following way: Place the arm on the side of the breast you are examining above your head. Feel your breast with the other hand. Start in the nipple area and make -inch (2 cm) overlapping  circles to feel your breast. Use the pads of your three middle fingers to do this. Apply light pressure, then medium pressure, then firm pressure. The light pressure will allow you to feel the tissue closest to the skin. The medium pressure will allow you to feel the tissue that is a little deeper. The firm pressure will allow you to feel the tissue close to the ribs. Continue the overlapping circles, moving downward over the breast until you feel your ribs below your breast. Move one finger-width toward the center of the body. Continue to use the -inch (2 cm) overlapping circles to feel your breast as you move slowly up toward your collarbone. Continue the up-and-down exam using all three pressures until you reach your armpit.  Write down what you find Writing down what you find can help you remember what to discuss with your health care provider. Write down: What is normal for each breast. Any changes that you find in each breast, including: The kind of changes you find. Any pain or tenderness. Size and location of any lumps. Where you are in your menstrual cycle, if you are still menstruating. General tips and recommendations Examine your breasts every month. If you are breastfeeding, the best time to examine your breasts is after a feeding or after using a breast pump. If you menstruate, the best time to examine your breasts is 5-7 days after your period. Breasts are generally lumpier during menstrual periods, and it may be more difficult to notice changes. With time and practice, you will become more familiar  with the variations in your breasts and more comfortable with the exam. Contact a health care provider if you: See a change in the shape or size of your breasts or nipples. See a change in the skin of your breast or nipples, such as a reddened or scaly area. Have unusual discharge from your nipples. Find a lump or thick area that was not there before. Have pain in your  breasts. Have any concerns related to your breast health. Summary Breast self-awareness includes looking for physical changes in your breasts, as well as feeling for any changes within your breasts. Breast self-awareness should be performed in front of a mirror in a well-lit room. You should examine your breasts every month. If you menstruate, the best time to examine your breasts is 5-7 days after your menstrual period. Let your health care provider know of any changes you notice in your breasts, including changes in size, changes on the skin, pain or tenderness, or unusual fluid from your nipples. This information is not intended to replace advice given to you by your health care provider. Make sure you discuss any questions you have with your health care provider. Document Revised: 08/15/2017 Document Reviewed: 08/15/2017 Elsevier Patient Education  [place].

## 2021-02-10 NOTE — Progress Notes (Signed)
[facility] Follow up:    Chesley Noon, MD [place] [id number]   DIAGNOSIS: Stage IIA breast cancer in 2006  SUMMARY OF ONCOLOGIC HISTORY: CHEK2 positive [place] woman   (1) status post right lumpectomy and sentinel lymph node sampling in 2006 (age 51) for a pT1 pN1 (mic), stage IIA invasive ductal carcinoma, grade 2, estrogen and progesterone receptor positive, HER-2 not amplified, with positive margins   (2) status post right mastectomy and full axillary lymph node dissection 2006 showing an additional 0.6 cm of tumor, but clear margins and 0 of 10 axillary lymph nodes involved             (a) status post right breast reconstruction 2, including a latissimus flap, both failed secondary to staph infections    (3) treated adjuvantly with doxorubicin and cyclophosphamide in dose dense fashion 4 followed by paclitaxel 8   (4) completed adjuvant radiation spring of 2007   (5) received goserelin and tamoxifen for 5 years, (through 2012)   (6) resumed tamoxifen 2014, discontinuing it in 2016, for a total of 8 years of anti-estrogen therapy             (a) Is not clear that any additional antiestrogen therapy would further reduce risk   (7) genetics testing 01/15/2016 through the Comprehensive Cancer Panel offered by [facility] identified a single, heterozygous pathogenic gene mutation called CHEK2, [id number]. There were no deleterious mutations in APC, ATM, AXIN2, BAP1, BARD1, BMPR1A, BRCA1, BRCA2, BRIP1, CDH1, CDK4, CDKN2A, CHEK2, EPCAM, FANCC, FH, FLCN, HOXB13, MET, MITF, MLH1, MSH2, MSH6, MUTYH, NBN, NF1, NTHL1, PALB2, PMS2, POLD1, POLE, POT1, PTEN, RAD51C, RAD51D, RECQL, SCG5/GREM1, SDHB, SDHC, SDHD, SMAD4, STK11, TP53, TSC!, TSC2 and VHL..             (a) will need yearly MRI of the breast in addition to yearly mammography             (b) colonoscopy 03/01/2016 [facility]; repeat every 5 years)  CURRENT THERAPY:  observation  INTERVAL HISTORY: Nykiah Ma 51 y.o. female returns for follow-up of her history of breast cancer and CHEK2 positivity.  Her CHEK2 positivity puts her at increased for additional breast cancers and colon cancers.  She underwent a screening mammogram May 13, 2020 that showed no evidence of malignancy and breast density category B.  She also underwent breast MRI that showed no evidence of malignancy on October 17, 2019.  Sheppard Coil is recommended to undergo colonoscopies every 5 years.  Her most recent colonoscopy was in 2018.  She is preparing to get this scheduled for this year.  She exercises regularly.  She is up-to-date with her primary care follow-up.  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  MEDICAL HISTORY: Past Medical History:  Diagnosis Date   Breast cancer ([place])    Family history of kidney cancer    Family history of prostate cancer     SURGICAL HISTORY: Past Surgical History:  Procedure Laterality Date   MASTECTOMY      SOCIAL HISTORY: Social History   Socioeconomic History   Marital status: Married  Spouse name: Not on file   Number of children: Not on file   Years of education: Not on file   Highest education level: Not on file  Occupational History   Not on file  Tobacco Use   Smoking status: Never   Smokeless tobacco: Never  Substance and Sexual Activity   Alcohol use: Yes    Comment: rare   Drug use: No   Sexual activity: Not on file  Other Topics Concern   Not on file  Social History Narrative   Not on file   Social Determinants of Health   Financial Resource Strain:  Not on file  Food Insecurity: Not on file  Transportation Needs: Not on file  Physical Activity: Not on file  Stress: Not on file  Social Connections: Not on file  Intimate Partner Violence: Not on file    FAMILY HISTORY: Family History  Problem Relation Age of Onset   Kidney cancer Mother 56   Prostate cancer Father 64   Leukemia Maternal Uncle        died in his 30s   Cancer Maternal Grandfather        unknown form   Lung cancer Paternal Grandfather        smoker and a Ecologist   Breast cancer Neg Hx     Review of Systems  Constitutional:  Negative for appetite change, chills, fatigue, fever and unexpected weight change.  HENT:   Negative for hearing loss, lump/mass and trouble swallowing.   Eyes:  Negative for eye problems and icterus.  Respiratory:  Negative for chest tightness, cough and shortness of breath.   Cardiovascular:  Negative for chest pain, leg swelling and palpitations.  Gastrointestinal:  Negative for abdominal distention, abdominal pain, constipation, diarrhea, nausea and vomiting.  Endocrine: Negative for hot flashes.  Genitourinary:  Negative for difficulty urinating.   Musculoskeletal:  Negative for arthralgias.  Skin:  Negative for itching and rash.  Neurological:  Negative for dizziness, extremity weakness, headaches and numbness.  Hematological:  Negative for adenopathy. Does not bruise/bleed easily.  Psychiatric/Behavioral:  Negative for depression. The patient is not nervous/anxious.      PHYSICAL EXAMINATION  ECOG PERFORMANCE STATUS: 0 - Asymptomatic  Vitals:   02/10/21 1123  BP: (!) 134/91  Pulse: 87  Resp: 16  Temp: 98.5 F (36.9 C)  SpO2: 97%    Physical Exam Constitutional:      General: She is not in acute distress.    Appearance: Normal appearance. She is not toxic-appearing.  HENT:     Head: Normocephalic and atraumatic.  Eyes:     General: No scleral icterus. Cardiovascular:     Rate and Rhythm: Normal rate and  regular rhythm.     Pulses: Normal pulses.     Heart sounds: Normal heart sounds.  Pulmonary:     Effort: Pulmonary effort is normal.     Breath sounds: Normal breath sounds.  Chest:     Comments: Right breast status postmastectomy and reconstruction which was unsuccessful and implants were removed.  There is no sign of local recurrence.  Left breast is benign Abdominal:     General: Abdomen is flat. Bowel sounds are normal. There is no distension.     Palpations: Abdomen is soft.     Tenderness: There is no abdominal tenderness.  Musculoskeletal:        General: No swelling.     Cervical back: Neck supple.  Lymphadenopathy:     Cervical: No cervical  adenopathy.  Skin:    General: Skin is warm and dry.     Findings: No rash.  Neurological:     General: No focal deficit present.     Mental Status: She is alert.  Psychiatric:        Mood and Affect: Mood normal.        Behavior: Behavior normal.    LABORATORY DATA:  CBC    Component Value Date/Time   WBC 7.2 02/10/2021 1120   WBC 6.6 09/30/2019 0840   RBC 4.61 02/10/2021 1120   HGB 14.3 02/10/2021 1120   HGB 14.0 06/01/2016 1409   HCT 42.2 02/10/2021 1120   HCT 41.6 06/01/2016 1409   PLT 359 02/10/2021 1120   PLT 293 06/01/2016 1409   MCV 91.5 02/10/2021 1120   MCV 93.5 06/01/2016 1409   MCH 31.0 02/10/2021 1120   MCHC 33.9 02/10/2021 1120   RDW 12.5 02/10/2021 1120   RDW 12.6 06/01/2016 1409   LYMPHSABS 2.3 02/10/2021 1120   LYMPHSABS 2.5 06/01/2016 1409   MONOABS 0.5 02/10/2021 1120   MONOABS 0.5 06/01/2016 1409   EOSABS 0.2 02/10/2021 1120   EOSABS 0.1 06/01/2016 1409   BASOSABS 0.0 02/10/2021 1120   BASOSABS 0.0 06/01/2016 1409    CMP     Component Value Date/Time   NA 136 02/10/2021 1120   NA 140 06/01/2016 1409   K 3.7 02/10/2021 1120   K 4.0 06/01/2016 1409   CL 103 02/10/2021 1120   CO2 28 02/10/2021 1120   CO2 24 06/01/2016 1409   GLUCOSE 105 (H) 02/10/2021 1120   GLUCOSE 101 06/01/2016 1409    BUN 13 02/10/2021 1120   BUN 19.0 06/01/2016 1409   CREATININE 0.79 02/10/2021 1120   CREATININE 0.9 06/01/2016 1409   CALCIUM 9.3 02/10/2021 1120   CALCIUM 9.2 06/01/2016 1409   PROT 7.5 02/10/2021 1120   PROT 7.1 06/01/2016 1409   ALBUMIN 4.5 02/10/2021 1120   ALBUMIN 4.1 06/01/2016 1409   AST 19 02/10/2021 1120   AST 19 06/01/2016 1409   ALT 18 02/10/2021 1120   ALT 16 06/01/2016 1409   ALKPHOS 49 02/10/2021 1120   ALKPHOS 49 06/01/2016 1409   BILITOT 0.4 02/10/2021 1120   BILITOT 0.24 06/01/2016 1409   GFRNONAA >60 02/10/2021 1120   GFRAA >60 09/30/2019 0840   GFRAA >60 06/21/2017 1258      ASSESSMENT and THERAPY PLAN:   Malignant neoplasm of overlapping sites of right breast in female, estrogen receptor positive ([place]) Sheppard Coil is a 51 year old woman who is status post estrogen positive breast cancer diagnosed in 2006 who received treatment with surgery, chemotherapy, antiestrogen therapy which she has completed.  She is here today for follow-up and surveillance.  1.  History of right estrogen positive breast cancer: She has no clinical or radiographic sign of breast cancer recurrence.  2.  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  She had not had this completed since October 2021.  I placed orders for this to be completed in the next month.  She is due for mammogram in May 2023.  She plans on scheduling her repeat colonoscopy.  Jovita will return in 1 year for continued observation.  I reviewed with her Dr. Chryl Heck who is stepping in and Dr. Virgie Dad place.  Also gave Sheppard Coil information about breast awareness in her after visit summary.     All questions were answered. The patient knows to call the clinic with any  problems, questions or concerns. We can certainly see the patient much sooner if necessary.  Total encounter time: 20 minutes in face-to-face visit time, chart review, lab review, care  coordination, order entry, and documentation of the encounter.  Wilber Bihari, NP 02/10/21 12:32 PM Medical Oncology and Hematology [facility] [place], [place] [address] Tel. [phone]    Fax. [phone]  *Total Encounter Time as defined by the Centers for Medicare and Medicaid Services includes, in addition to the face-to-face time of a patient visit (documented in the note above) non-face-to-face time: obtaining and reviewing outside history, ordering and reviewing medications, tests or procedures, care coordination (communications with other health care professionals or caregivers) and documentation in the medical record.

## 2021-02-19 ENCOUNTER — Other Ambulatory Visit: Payer: Self-pay

## 2021-02-19 ENCOUNTER — Ambulatory Visit
Admission: RE | Admit: 2021-02-19 | Discharge: 2021-02-19 | Disposition: A | Discharge status: Home / Self Care | Payer: No Typology Code available for payment source | Source: Ambulatory Visit | Attending: Adult Health | Admitting: Adult Health

## 2021-02-19 DIAGNOSIS — Z170 Estrogen receptor positive status [ER+]: Secondary | ICD-10-CM

## 2021-02-19 DIAGNOSIS — Z1509 Genetic susceptibility to other malignant neoplasm: Secondary | ICD-10-CM

## 2021-02-19 DIAGNOSIS — C50811 Malignant neoplasm of overlapping sites of right female breast: Secondary | ICD-10-CM

## 2021-02-19 DIAGNOSIS — C50919 Malignant neoplasm of unspecified site of unspecified female breast: Secondary | ICD-10-CM

## 2021-02-19 MED ORDER — GADOBUTROL 1 MMOL/ML IV SOLN
8.0000 mL | Freq: Once | INTRAVENOUS | Status: AC | PRN
  Administered 2021-02-19: 8 mL via INTRAVENOUS

## 2021-08-04 ENCOUNTER — Other Ambulatory Visit: Payer: Self-pay | Admitting: Hematology and Oncology

## 2021-08-04 DIAGNOSIS — Z1231 Encounter for screening mammogram for malignant neoplasm of breast: Secondary | ICD-10-CM

## 2021-08-04 DIAGNOSIS — Z853 Personal history of malignant neoplasm of breast: Secondary | ICD-10-CM

## 2021-08-25 ENCOUNTER — Ambulatory Visit
Admission: RE | Admit: 2021-08-25 | Discharge: 2021-08-25 | Disposition: A | Discharge status: Home / Self Care | Payer: BC Managed Care – PPO | Source: Ambulatory Visit | Attending: Hematology and Oncology | Admitting: Hematology and Oncology

## 2021-08-25 DIAGNOSIS — Z853 Personal history of malignant neoplasm of breast: Secondary | ICD-10-CM

## 2021-08-25 DIAGNOSIS — Z1231 Encounter for screening mammogram for malignant neoplasm of breast: Secondary | ICD-10-CM

## 2021-09-22 ENCOUNTER — Ambulatory Visit: Payer: BC Managed Care – PPO

## 2021-10-07 ENCOUNTER — Ambulatory Visit
Admission: RE | Admit: 2021-10-07 | Discharge: 2021-10-07 | Disposition: A | Discharge status: Home / Self Care | Payer: BC Managed Care – PPO | Source: Ambulatory Visit | Attending: Hematology and Oncology | Admitting: Hematology and Oncology

## 2022-02-09 ENCOUNTER — Inpatient Hospital Stay: Payer: BC Managed Care – PPO | Attending: Hematology and Oncology | Admitting: Hematology and Oncology

## 2022-02-09 ENCOUNTER — Other Ambulatory Visit: Payer: Self-pay

## 2022-02-09 ENCOUNTER — Encounter: Payer: Self-pay | Admitting: Hematology and Oncology

## 2022-02-09 VITALS — BP 139/84 | HR 95 | Temp 97.5°F | Resp 16 | Wt 176.1 lb

## 2022-02-09 DIAGNOSIS — Z170 Estrogen receptor positive status [ER+]: Secondary | ICD-10-CM

## 2022-02-09 DIAGNOSIS — Z853 Personal history of malignant neoplasm of breast: Secondary | ICD-10-CM | POA: Diagnosis present

## 2022-02-09 DIAGNOSIS — Z1502 Genetic susceptibility to malignant neoplasm of ovary: Secondary | ICD-10-CM

## 2022-02-09 DIAGNOSIS — C50811 Malignant neoplasm of overlapping sites of right female breast: Secondary | ICD-10-CM

## 2022-02-09 DIAGNOSIS — Z923 Personal history of irradiation: Secondary | ICD-10-CM | POA: Insufficient documentation

## 2022-02-09 DIAGNOSIS — Z8042 Family history of malignant neoplasm of prostate: Secondary | ICD-10-CM | POA: Diagnosis not present

## 2022-02-09 DIAGNOSIS — Z1501 Genetic susceptibility to malignant neoplasm of breast: Secondary | ICD-10-CM | POA: Insufficient documentation

## 2022-02-09 DIAGNOSIS — Z1509 Genetic susceptibility to other malignant neoplasm: Secondary | ICD-10-CM

## 2022-02-09 DIAGNOSIS — C50919 Malignant neoplasm of unspecified site of unspecified female breast: Secondary | ICD-10-CM | POA: Diagnosis not present

## 2022-02-09 DIAGNOSIS — Z8051 Family history of malignant neoplasm of kidney: Secondary | ICD-10-CM | POA: Insufficient documentation

## 2022-02-09 DIAGNOSIS — Z801 Family history of malignant neoplasm of trachea, bronchus and lung: Secondary | ICD-10-CM | POA: Insufficient documentation

## 2022-02-09 DIAGNOSIS — Z1589 Genetic susceptibility to other disease: Secondary | ICD-10-CM

## 2022-02-09 DIAGNOSIS — Z9221 Personal history of antineoplastic chemotherapy: Secondary | ICD-10-CM | POA: Insufficient documentation

## 2022-02-09 DIAGNOSIS — Z9011 Acquired absence of right breast and nipple: Secondary | ICD-10-CM | POA: Insufficient documentation

## 2022-02-09 NOTE — Progress Notes (Signed)
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, with positive margins   (2) status post right mastectomy and full axillary lymph node dissection 2006 showing an additional 0.6 cm of tumor, but clear margins and 0 of 10 axillary lymph nodes involved             (a) status post right breast reconstruction 2, including a latissimus flap, both failed secondary to staph infections    (3) treated adjuvantly with doxorubicin and cyclophosphamide in dose dense fashion 4 followed by paclitaxel 8   (4) completed adjuvant radiation spring of 2007   (5) received goserelin and tamoxifen for 5 years, (through 2012)   (6) resumed tamoxifen 2014, discontinuing it in 2016, for a total of 8 years of anti-estrogen therapy             (a) Is not clear that any additional antiestrogen therapy would further reduce risk   (7) genetics testing 01/15/2016 through the Comprehensive Cancer Panel offered by [facility] identified a single, heterozygous pathogenic gene mutation called CHEK2, c.1100delC. There were no deleterious mutations in APC, ATM, AXIN2, BAP1, BARD1, BMPR1A, BRCA1, BRCA2, BRIP1, CDH1, CDK4, CDKN2A, CHEK2, EPCAM, FANCC, FH, FLCN, HOXB13, MET, MITF, MLH1, MSH2, MSH6, MUTYH, NBN, NF1, NTHL1, PALB2, PMS2, POLD1, POLE, POT1, PTEN, RAD51C, RAD51D, RECQL, SCG5/GREM1, SDHB, SDHC, SDHD, SMAD4, STK11, TP53, TSC!, TSC2 and VHL..             (a) will need yearly MRI of the breast in addition to yearly mammography             (b) colonoscopy 03/01/2016 [facility]; repeat every 5 years)  CURRENT THERAPY:  observation  INTERVAL HISTORY:  Erika Foster 52 y.o. female returns for follow-up of her history of breast cancer and CHEK2 positivity.   Her CHEK2 positivity puts her at increased for additional breast cancers and colon cancers.   She had mammogram on October 07, 2021, no findings suspicious for malignancy.  She is status post right mastectomy. Sheppard Coil is recommended to undergo colonoscopies every 5 years.  She is up-to-date with a colonoscopy.  She had it in 2023 which was without any concerns. She exercises regularly.  She is up-to-date with her primary care follow-up. She denies any new changes in her breast.  She would like to continue follow-up with the breast clinic once a year if possible.  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  MEDICAL HISTORY: Past Medical History:  Diagnosis Date   Breast cancer ([place])    Family history of kidney cancer    Family history of prostate cancer     SURGICAL HISTORY: Past Surgical History:  Procedure Laterality Date  MASTECTOMY      SOCIAL HISTORY: Social History   Socioeconomic History   Marital status: Married    Spouse name: Not on file   Number of children: Not on file   Years of education: Not on file   Highest education level: Not on file  Occupational History   Not on file  Tobacco Use   Smoking status: Never   Smokeless tobacco: Never  Substance and Sexual Activity   Alcohol use: Yes    Comment: rare   Drug use: No   Sexual activity: Not on file  Other Topics Concern   Not on file  Social  History Narrative   Not on file   Social Determinants of Health   Financial Resource Strain: Not on file  Food Insecurity: Not on file  Transportation Needs: Not on file  Physical Activity: Not on file  Stress: Not on file  Social Connections: Not on file  Intimate Partner Violence: Not on file    FAMILY HISTORY: Family History  Problem Relation Age of Onset   Kidney cancer Mother 49   Prostate cancer Father 71   Leukemia Maternal Uncle        died in his 71s   Cancer Maternal Grandfather        unknown form   Lung cancer Paternal Grandfather        smoker and a Ecologist   Breast cancer Neg Hx     Review of Systems  Constitutional:  Negative for appetite change, chills, fatigue, fever and unexpected weight change.  HENT:   Negative for hearing loss, lump/mass and trouble swallowing.   Eyes:  Negative for eye problems and icterus.  Respiratory:  Negative for chest tightness, cough and shortness of breath.   Cardiovascular:  Negative for chest pain, leg swelling and palpitations.  Gastrointestinal:  Negative for abdominal distention, abdominal pain, constipation, diarrhea, nausea and vomiting.  Endocrine: Negative for hot flashes.  Genitourinary:  Negative for difficulty urinating.   Musculoskeletal:  Negative for arthralgias.  Skin:  Negative for itching and rash.  Neurological:  Negative for dizziness, extremity weakness, headaches and numbness.  Hematological:  Negative for adenopathy. Does not bruise/bleed easily.  Psychiatric/Behavioral:  Negative for depression. The patient is not nervous/anxious.       PHYSICAL EXAMINATION  ECOG PERFORMANCE STATUS: 0 - Asymptomatic  Vitals:   02/09/22 1152  BP: 139/84  Pulse: 95  Resp: 16  Temp: (!) 97.5 F (36.4 C)  SpO2: 100%    Physical Exam Constitutional:      General: She is not in acute distress.    Appearance: Normal appearance. She is not toxic-appearing.  HENT:     Head: Normocephalic and atraumatic.   Eyes:     General: No scleral icterus. Cardiovascular:     Rate and Rhythm: Normal rate and regular rhythm.     Pulses: Normal pulses.     Heart sounds: Normal heart sounds.  Pulmonary:     Effort: Pulmonary effort is normal.     Breath sounds: Normal breath sounds.  Chest:     Comments: Right breast status postmastectomy and reconstruction which was unsuccessful and implants were removed.  There is no sign of local recurrence.  Left breast is benign.  No palpable masses or regional adenopathy Abdominal:     General: Bowel sounds are normal. There is no distension.     Tenderness: There is no abdominal tenderness.  Musculoskeletal:        General:  No swelling.     Cervical back: Neck supple.  Lymphadenopathy:     Cervical: No cervical adenopathy.  Skin:    General: Skin is warm and dry.     Findings: No rash.  Neurological:     Mental Status: She is alert.  Psychiatric:        Mood and Affect: Mood normal.     LABORATORY DATA:  CBC    Component Value Date/Time   WBC 7.2 02/10/2021 1120   WBC 6.6 09/30/2019 0840   RBC 4.61 02/10/2021 1120   HGB 14.3 02/10/2021 1120   HGB 14.0 06/01/2016 1409   HCT 42.2 02/10/2021 1120   HCT 41.6 06/01/2016 1409   PLT 359 02/10/2021 1120   PLT 293 06/01/2016 1409   MCV 91.5 02/10/2021 1120   MCV 93.5 06/01/2016 1409   MCH 31.0 02/10/2021 1120   MCHC 33.9 02/10/2021 1120   RDW 12.5 02/10/2021 1120   RDW 12.6 06/01/2016 1409   LYMPHSABS 2.3 02/10/2021 1120   LYMPHSABS 2.5 06/01/2016 1409   MONOABS 0.5 02/10/2021 1120   MONOABS 0.5 06/01/2016 1409   EOSABS 0.2 02/10/2021 1120   EOSABS 0.1 06/01/2016 1409   BASOSABS 0.0 02/10/2021 1120   BASOSABS 0.0 06/01/2016 1409    CMP     Component Value Date/Time   NA 136 02/10/2021 1120   NA 140 06/01/2016 1409   K 3.7 02/10/2021 1120   K 4.0 06/01/2016 1409   CL 103 02/10/2021 1120   CO2 28 02/10/2021 1120   CO2 24 06/01/2016 1409   GLUCOSE 105 (H) 02/10/2021 1120   GLUCOSE  101 06/01/2016 1409   BUN 13 02/10/2021 1120   BUN 19.0 06/01/2016 1409   CREATININE 0.79 02/10/2021 1120   CREATININE 0.9 06/01/2016 1409   CALCIUM 9.3 02/10/2021 1120   CALCIUM 9.2 06/01/2016 1409   PROT 7.5 02/10/2021 1120   PROT 7.1 06/01/2016 1409   ALBUMIN 4.5 02/10/2021 1120   ALBUMIN 4.1 06/01/2016 1409   AST 19 02/10/2021 1120   AST 19 06/01/2016 1409   ALT 18 02/10/2021 1120   ALT 16 06/01/2016 1409   ALKPHOS 49 02/10/2021 1120   ALKPHOS 49 06/01/2016 1409   BILITOT 0.4 02/10/2021 1120   BILITOT 0.24 06/01/2016 1409   GFRNONAA >60 02/10/2021 1120   GFRAA >60 09/30/2019 0840   GFRAA >60 06/21/2017 1258      ASSESSMENT and THERAPY PLAN:   52 y.o. CHEK2 positive [place] woman   (1) status post right lumpectomy and sentinel lymph node sampling in 2006 (age 28) for a pT1 pN1 (mic), stage IIA invasive ductal carcinoma, grade 2, estrogen and progesterone receptor positive, HER-2 not amplified, with positive margins   (2) status post right mastectomy and full axillary lymph node dissection 2006 showing an additional 0.6 cm of tumor, but clear margins and 0 of 10 axillary lymph nodes involved             (a) status post right breast reconstruction 2, including a latissimus flap, both failed secondary to staph infections    (3) treated adjuvantly with doxorubicin and cyclophosphamide in dose dense fashion 4 followed by paclitaxel 8   (4) completed adjuvant radiation spring of 2007   (5) received goserelin and tamoxifen for 5 years, (through 2012)   (6) resumed tamoxifen 2014, discontinuing it in 2016, for a total of 8 years of anti-estrogen therapy             (a) Is  not clear that any additional antiestrogen therapy would further reduce risk   (7) genetics testing 01/15/2016 through the Comprehensive Cancer Panel offered by [facility] identified a single, heterozygous pathogenic gene mutation called CHEK2, c.1100delC. There were no deleterious mutations  in APC, ATM, AXIN2, BAP1, BARD1, BMPR1A, BRCA1, BRCA2, BRIP1, CDH1, CDK4, CDKN2A, CHEK2, EPCAM, FANCC, FH, FLCN, HOXB13, MET, MITF, MLH1, MSH2, MSH6, MUTYH, NBN, NF1, NTHL1, PALB2, PMS2, POLD1, POLE, POT1, PTEN, RAD51C, RAD51D, RECQL, SCG5/GREM1, SDHB, SDHC, SDHD, SMAD4, STK11, TP53, TSC!, TSC2 and VHL..             (a) will need yearly MRI of the breast in addition to yearly mammography             (b) colonoscopy 03/01/2016 [facility]; repeat every 5 years)     PLAN:  Ms. Vida Rigger is here for follow-up.  She is up-to-date with her mammograms and MRIs and denies any health changes.  She continues to regularly exercise but has to deal with the weight gain issues and hence recently started [facility].  No concerns on physical examination, no evidence of recurrence. She undergoes intensified screening given her CHEK2 mutation.  Last MRI back in February 2023 with no evidence of malignancy.  Mammogram of the left breast in September without any evidence of malignancy. She is interested in continuing abbreviated MRIs once a year alternating with mammograms.  She is up-to-date with her colonoscopy.  She will return to clinic in 1 year or sooner as needed. Total encounter time 30 minutes.*  *Total Encounter Time as defined by the Centers for Medicare and Medicaid Services includes, in addition to the face-to-face time of a patient visit (documented in the note above) non-face-to-face time: obtaining and reviewing outside history, ordering and reviewing medications, tests or procedures, care coordination (communications with other health care professionals or caregivers) and documentation in the medical record.

## 2022-02-10 ENCOUNTER — Other Ambulatory Visit: Payer: Self-pay | Admitting: *Deleted

## 2022-02-10 ENCOUNTER — Other Ambulatory Visit: Payer: Self-pay | Admitting: Hematology and Oncology

## 2022-02-10 DIAGNOSIS — Z170 Estrogen receptor positive status [ER+]: Secondary | ICD-10-CM

## 2022-02-10 DIAGNOSIS — C50919 Malignant neoplasm of unspecified site of unspecified female breast: Secondary | ICD-10-CM

## 2022-03-01 ENCOUNTER — Other Ambulatory Visit (HOSPITAL_COMMUNITY): Payer: BC Managed Care – PPO

## 2022-03-29 ENCOUNTER — Encounter: Payer: Self-pay | Admitting: Hematology and Oncology

## 2022-03-31 ENCOUNTER — Ambulatory Visit
Admission: RE | Admit: 2022-03-31 | Discharge: 2022-03-31 | Disposition: A | Discharge status: Home / Self Care | Payer: BC Managed Care – PPO | Source: Ambulatory Visit | Attending: Hematology and Oncology | Admitting: Hematology and Oncology

## 2022-03-31 DIAGNOSIS — Z170 Estrogen receptor positive status [ER+]: Secondary | ICD-10-CM

## 2022-03-31 DIAGNOSIS — C50919 Malignant neoplasm of unspecified site of unspecified female breast: Secondary | ICD-10-CM

## 2022-03-31 MED ORDER — GADOPICLENOL 0.5 MMOL/ML IV SOLN
8.0000 mL | Freq: Once | INTRAVENOUS | Status: AC | PRN
  Administered 2022-03-31: 8 mL via INTRAVENOUS

## 2022-04-12 ENCOUNTER — Encounter: Payer: Self-pay | Admitting: Hematology and Oncology

## 2022-10-07 ENCOUNTER — Other Ambulatory Visit: Payer: Self-pay | Admitting: Hematology and Oncology

## 2022-10-07 DIAGNOSIS — Z1231 Encounter for screening mammogram for malignant neoplasm of breast: Secondary | ICD-10-CM

## 2022-11-02 ENCOUNTER — Ambulatory Visit
Admission: RE | Admit: 2022-11-02 | Discharge: 2022-11-02 | Disposition: A | Discharge status: Home / Self Care | Payer: BC Managed Care – PPO | Source: Ambulatory Visit | Attending: Hematology and Oncology | Admitting: Hematology and Oncology

## 2022-11-02 DIAGNOSIS — Z1231 Encounter for screening mammogram for malignant neoplasm of breast: Secondary | ICD-10-CM

## 2023-02-14 ENCOUNTER — Telehealth: Payer: Self-pay | Admitting: Hematology and Oncology

## 2023-02-14 NOTE — Telephone Encounter (Signed)
Attempted to call the patient to inform her of the changes made. Sent the patient a message in MyChart of the appointment changes.

## 2023-02-14 NOTE — Telephone Encounter (Signed)
Rescheduled appointment per patient request. Patient is aware of the made appointment via MyChart.

## 2023-02-15 ENCOUNTER — Ambulatory Visit: Payer: BC Managed Care – PPO | Admitting: Hematology and Oncology

## 2023-02-16 ENCOUNTER — Ambulatory Visit: Payer: BC Managed Care – PPO | Admitting: Hematology and Oncology

## 2023-03-01 ENCOUNTER — Ambulatory Visit: Payer: BC Managed Care – PPO | Admitting: Hematology and Oncology

## 2023-03-08 ENCOUNTER — Inpatient Hospital Stay: Payer: BC Managed Care – PPO | Attending: Hematology and Oncology | Admitting: Hematology and Oncology

## 2023-03-08 ENCOUNTER — Encounter: Payer: Self-pay | Admitting: Hematology and Oncology

## 2023-03-08 VITALS — BP 125/68 | HR 83 | Temp 98.4°F | Resp 18 | Ht 65.0 in | Wt 185.3 lb

## 2023-03-08 DIAGNOSIS — Z1589 Genetic susceptibility to other disease: Secondary | ICD-10-CM | POA: Diagnosis not present

## 2023-03-08 DIAGNOSIS — C50919 Malignant neoplasm of unspecified site of unspecified female breast: Secondary | ICD-10-CM

## 2023-03-08 DIAGNOSIS — Z923 Personal history of irradiation: Secondary | ICD-10-CM | POA: Insufficient documentation

## 2023-03-08 DIAGNOSIS — Z806 Family history of leukemia: Secondary | ICD-10-CM | POA: Diagnosis not present

## 2023-03-08 DIAGNOSIS — Z8051 Family history of malignant neoplasm of kidney: Secondary | ICD-10-CM | POA: Diagnosis not present

## 2023-03-08 DIAGNOSIS — Z1509 Genetic susceptibility to other malignant neoplasm: Secondary | ICD-10-CM | POA: Diagnosis not present

## 2023-03-08 DIAGNOSIS — Z1501 Genetic susceptibility to malignant neoplasm of breast: Secondary | ICD-10-CM | POA: Insufficient documentation

## 2023-03-08 DIAGNOSIS — Z1502 Genetic susceptibility to malignant neoplasm of ovary: Secondary | ICD-10-CM

## 2023-03-08 DIAGNOSIS — Z853 Personal history of malignant neoplasm of breast: Secondary | ICD-10-CM | POA: Insufficient documentation

## 2023-03-08 DIAGNOSIS — Z9011 Acquired absence of right breast and nipple: Secondary | ICD-10-CM | POA: Diagnosis not present

## 2023-03-08 DIAGNOSIS — Z9221 Personal history of antineoplastic chemotherapy: Secondary | ICD-10-CM | POA: Diagnosis not present

## 2023-03-08 DIAGNOSIS — Z801 Family history of malignant neoplasm of trachea, bronchus and lung: Secondary | ICD-10-CM | POA: Diagnosis not present

## 2023-03-08 DIAGNOSIS — Z8042 Family history of malignant neoplasm of prostate: Secondary | ICD-10-CM | POA: Insufficient documentation

## 2023-03-08 NOTE — Progress Notes (Signed)
 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   (1) status post right lumpectomy and sentinel lymph node sampling in 2006 (age 53) for a pT1 pN1 (mic), stage IIA invasive ductal carcinoma, grade 2, estrogen and progesterone receptor positive, HER-2 not amplified, with positive margins   (2) status post right mastectomy and full axillary lymph node dissection 2006 showing an additional 0.6 cm of tumor, but clear margins and 0 of 10 axillary lymph nodes involved             (a) status post right breast reconstruction 2, including a latissimus flap, both failed secondary to staph infections    (3) treated adjuvantly with doxorubicin and cyclophosphamide in dose dense fashion 4 followed by paclitaxel 8   (4) completed adjuvant radiation spring of 2007   (5) received goserelin and tamoxifen for 5 years, (through 2012)   (6) resumed tamoxifen 2014, discontinuing it in 2016, for a total of 8 years of anti-estrogen therapy             (a) Is not clear that any additional antiestrogen therapy would further reduce risk   (7) genetics testing 01/15/2016 through the Comprehensive Cancer Panel offered by [facility] identified a single, heterozygous pathogenic gene mutation called CHEK2, c.1100delC. There were no deleterious mutations in APC, ATM, AXIN2, BAP1, BARD1, BMPR1A, BRCA1, BRCA2, BRIP1, CDH1, CDK4, CDKN2A, CHEK2, EPCAM, FANCC, FH, FLCN, HOXB13, MET, MITF, MLH1, MSH2, MSH6, MUTYH, NBN, NF1, NTHL1, PALB2, PMS2, POLD1, POLE, POT1, PTEN, RAD51C, RAD51D, RECQL, SCG5/GREM1, SDHB, SDHC, SDHD, SMAD4, STK11, TP53, TSC!, TSC2 and VHL..             (a) will need yearly MRI of the breast in addition to yearly mammography             (b) colonoscopy 03/01/2016 Elnoria Howard; repeat every 5 years)  CURRENT THERAPY:  observation  INTERVAL HISTORY:  Discussed the use of AI scribe software for clinical note transcription with the patient, who gave verbal consent to proceed.  History of Present Illness     Erika Foster 53 y.o. female returns for follow-up of her history of breast cancer and CHEK2 positivity.    She has a history of breast cancer, initially diagnosed in 2006 at the age of 58. She underwent a right lumpectomy followed by a mastectomy and full axillary lymph node dissection. The initial surgery revealed micrometastases to the first lymph node, but no further lymph node involvement was noted. She received chemotherapy and radiation therapy post-surgery and was on tamoxifen for a total of eight years, with a pause to undergo IVF and pregnancy. She has been off tamoxifen since completing the course.  She is undergoing intensive screening for breast cancer of the left breast due to a CHEK2 mutation, with alternating MRIs and mammograms. Her last MRI was in March 2024, and she is due for another soon. No new health changes since her last visit.  She has a history of infections following breast reconstruction surgeries, including a staph infection after the expander was swapped for a permanent implant, leading to multiple hospitalizations and eventual removal of the implants. No current issues related to this.  Her family history is significant for a sister who also has the CHEK2 mutation but is unaffected by it. Her sister is also undergoing intensive  screening. 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  MEDICAL HISTORY: Past Medical History:  Diagnosis Date   Breast cancer (HCC)    Family history of kidney cancer    Family history of prostate cancer     SURGICAL HISTORY: Past Surgical History:  Procedure Laterality Date   MASTECTOMY     REDUCTION MAMMAPLASTY      SOCIAL HISTORY: Social History   Socioeconomic History   Marital status: Married    Spouse name: Not on file   Number of children: Not on file   Years of education: Not on file   Highest education level: Not on file  Occupational History   Not on file  Tobacco Use   Smoking status: Never   Smokeless tobacco: Never  Substance and Sexual Activity   Alcohol use: Yes    Comment: rare   Drug use: No   Sexual activity: Not on file  Other Topics Concern   Not on file  Social History Narrative   Not on file   Social Drivers of Health   Financial Resource Strain: Low Risk  (01/18/2023)   Received from [facility]   Overall Financial Resource Strain (CARDIA)    Difficulty of Paying Living Expenses: Not hard at all  Food Insecurity: No Food Insecurity (01/18/2023)   Received from [facility]   Hunger Vital Sign    Worried About Running Out of Food in the Last Year: Never true    Ran Out of Food in the Last Year: Never true  Transportation Needs: No Transportation Needs (01/18/2023)   Received from [facility] - Transportation    Lack of Transportation (Medical): No    Lack of Transportation (Non-Medical): No  Physical Activity: Sufficiently Active (09/25/2022)   Received from [facility]   Exercise Vital Sign    Days of Exercise per Week: 6 days    Minutes of Exercise per Session: 50 min  Stress: No Stress Concern Present (09/25/2022)   Received from [facility] - Occupational Stress Questionnaire    Feeling of Stress : Only a little  Social Connections: Socially Integrated (09/25/2022)   Received from [facility]    Social Network    How would you rate your social network (family, work, friends)?: Good participation with social networks  Intimate Partner Violence: Not At Risk (09/25/2022)   Received from [facility]   HITS    Over the last 12 months how often did your partner physically hurt you?: Never    Over the last 12 months how often did your partner insult you or talk down to you?: Never    Over the last 12 months how often did your partner threaten you with physical harm?: Never    Over the last 12 months how often did your partner scream or curse at you?: Never    FAMILY HISTORY: Family History  Problem Relation Age of Onset   Kidney cancer Mother 6   Prostate cancer Father 61   Leukemia Maternal Uncle  died in his 43s   Cancer Maternal Grandfather        unknown form   Lung cancer Paternal Grandfather        smoker and a Forensic scientist   Breast cancer Neg Hx     Review of Systems  Constitutional:  Negative for appetite change, chills, fatigue, fever and unexpected weight change.  HENT:   Negative for hearing loss, lump/mass and trouble swallowing.   Eyes:  Negative for eye problems and icterus.  Respiratory:  Negative for chest tightness, cough and shortness of breath.   Cardiovascular:  Negative for chest pain, leg swelling and palpitations.  Gastrointestinal:  Negative for abdominal distention, abdominal pain, constipation, diarrhea, nausea and vomiting.  Endocrine: Negative for hot flashes.  Genitourinary:  Negative for difficulty urinating.   Musculoskeletal:  Negative for arthralgias.  Skin:  Negative for itching and rash.  Neurological:  Negative for dizziness, extremity weakness, headaches and numbness.  Hematological:  Negative for adenopathy. Does not bruise/bleed easily.  Psychiatric/Behavioral:  Negative for depression. The patient is not nervous/anxious.       PHYSICAL EXAMINATION  ECOG PERFORMANCE STATUS: 0 - Asymptomatic  There were no vitals filed for  this visit.   Physical Exam Constitutional:      General: She is not in acute distress.    Appearance: Normal appearance. She is not toxic-appearing.  Eyes:     General: No scleral icterus. Chest:     Comments: Right breast status postmastectomy and reconstruction which was unsuccessful and implants were removed.  There is no sign of local recurrence.  Left breast is benign.  No palpable masses or regional adenopathy Musculoskeletal:        General: No swelling.     Cervical back: Neck supple.  Lymphadenopathy:     Cervical: No cervical adenopathy.  Neurological:     Mental Status: She is alert.     LABORATORY DATA:  CBC    Component Value Date/Time   WBC 7.2 02/10/2021 1120   WBC 6.6 09/30/2019 0840   RBC 4.61 02/10/2021 1120   HGB 14.3 02/10/2021 1120   HGB 14.0 06/01/2016 1409   HCT 42.2 02/10/2021 1120   HCT 41.6 06/01/2016 1409   PLT 359 02/10/2021 1120   PLT 293 06/01/2016 1409   MCV 91.5 02/10/2021 1120   MCV 93.5 06/01/2016 1409   MCH 31.0 02/10/2021 1120   MCHC 33.9 02/10/2021 1120   RDW 12.5 02/10/2021 1120   RDW 12.6 06/01/2016 1409   LYMPHSABS 2.3 02/10/2021 1120   LYMPHSABS 2.5 06/01/2016 1409   MONOABS 0.5 02/10/2021 1120   MONOABS 0.5 06/01/2016 1409   EOSABS 0.2 02/10/2021 1120   EOSABS 0.1 06/01/2016 1409   BASOSABS 0.0 02/10/2021 1120   BASOSABS 0.0 06/01/2016 1409    CMP     Component Value Date/Time   NA 136 02/10/2021 1120   NA 140 06/01/2016 1409   K 3.7 02/10/2021 1120   K 4.0 06/01/2016 1409   CL 103 02/10/2021 1120   CO2 28 02/10/2021 1120   CO2 24 06/01/2016 1409   GLUCOSE 105 (H) 02/10/2021 1120   GLUCOSE 101 06/01/2016 1409   BUN 13 02/10/2021 1120   BUN 19.0 06/01/2016 1409   CREATININE 0.79 02/10/2021 1120   CREATININE 0.9 06/01/2016 1409   CALCIUM 9.3 02/10/2021 1120   CALCIUM 9.2 06/01/2016 1409   PROT 7.5 02/10/2021 1120   PROT 7.1 06/01/2016 1409   ALBUMIN 4.5 02/10/2021 1120   ALBUMIN 4.1  06/01/2016 1409   AST  19 02/10/2021 1120   AST 19 06/01/2016 1409   ALT 18 02/10/2021 1120   ALT 16 06/01/2016 1409   ALKPHOS 49 02/10/2021 1120   ALKPHOS 49 06/01/2016 1409   BILITOT 0.4 02/10/2021 1120   BILITOT 0.24 06/01/2016 1409   GFRNONAA >60 02/10/2021 1120   GFRAA >60 09/30/2019 0840   GFRAA >60 06/21/2017 1258      ASSESSMENT and THERAPY PLAN:   53 y.o. CHEK2 positive [place] woman   (1) status post right lumpectomy and sentinel lymph node sampling in 2006 (age 21) for a pT1 pN1 (mic), stage IIA invasive ductal carcinoma, grade 2, estrogen and progesterone receptor positive, HER-2 not amplified, with positive margins   (2) status post right mastectomy and full axillary lymph node dissection 2006 showing an additional 0.6 cm of tumor, but clear margins and 0 of 10 axillary lymph nodes involved             (a) status post right breast reconstruction 2, including a latissimus flap, both failed secondary to staph infections    (3) treated adjuvantly with doxorubicin and cyclophosphamide in dose dense fashion 4 followed by paclitaxel 8   (4) completed adjuvant radiation spring of 2007   (5) received goserelin and tamoxifen for 5 years, (through 2012)   (6) resumed tamoxifen 2014, discontinuing it in 2016, for a total of 8 years of anti-estrogen therapy             (a) Is not clear that any additional antiestrogen therapy would further reduce risk   (7) genetics testing 01/15/2016 through the Comprehensive Cancer Panel offered by [facility] identified a single, heterozygous pathogenic gene mutation called CHEK2, c.1100delC. There were no deleterious mutations in APC, ATM, AXIN2, BAP1, BARD1, BMPR1A, BRCA1, BRCA2, BRIP1, CDH1, CDK4, CDKN2A, CHEK2, EPCAM, FANCC, FH, FLCN, HOXB13, MET, MITF, MLH1, MSH2, MSH6, MUTYH, NBN, NF1, NTHL1, PALB2, PMS2, POLD1, POLE, POT1, PTEN, RAD51C, RAD51D, RECQL, SCG5/GREM1, SDHB, SDHC, SDHD, SMAD4, STK11, TP53, TSC!, TSC2 and VHL..             (a) will  need yearly MRI of the breast in addition to yearly mammography             (b) colonoscopy 03/01/2016 Elnoria Howard; repeat every 5 years)     PLAN:   History of Breast Cancer Diagnosed in 2006, treated with right lumpectomy, mastectomy, chemotherapy, radiation, and tamoxifen for a total of 8 years. No recurrence for nearly 20 years. -Continue annual MRI in spring and mammogram in fall for intensive screening due to CHEK2 mutation. -Order MRI for March 2025.  Colonoscopy Last colonoscopy performed in 2023 with a plan for every 5 years. -Next colonoscopy due in 2028.  Family History Sister also has CHEK2 mutation but is unaffected. -Continue current surveillance plan.  Follow-up Plan for annual visit unless any changes are noted.  Total encounter time 20 minutes.*  *Total Encounter Time as defined by the Centers for Medicare and Medicaid Services includes, in addition to the face-to-face time of a patient visit (documented in the note above) non-face-to-face time: obtaining and reviewing outside history, ordering and reviewing medications, tests or procedures, care coordination (communications with other health care professionals or caregivers) and documentation in the medical record.

## 2023-03-29 ENCOUNTER — Ambulatory Visit

## 2023-04-19 ENCOUNTER — Other Ambulatory Visit

## 2023-05-10 ENCOUNTER — Encounter: Payer: Self-pay | Admitting: Hematology and Oncology

## 2023-05-24 ENCOUNTER — Ambulatory Visit
Admission: RE | Admit: 2023-05-24 | Discharge: 2023-05-24 | Disposition: A | Discharge status: Home / Self Care | Source: Ambulatory Visit | Attending: Hematology and Oncology

## 2023-05-24 DIAGNOSIS — C50919 Malignant neoplasm of unspecified site of unspecified female breast: Secondary | ICD-10-CM

## 2023-05-24 MED ORDER — GADOPICLENOL 0.5 MMOL/ML IV SOLN
8.0000 mL | Freq: Once | INTRAVENOUS | Status: AC | PRN
  Administered 2023-05-24: 8 mL via INTRAVENOUS

## 2023-05-25 ENCOUNTER — Ambulatory Visit: Payer: Self-pay | Admitting: *Deleted

## 2023-11-08 ENCOUNTER — Ambulatory Visit
Admission: RE | Admit: 2023-11-08 | Discharge: 2023-11-08 | Disposition: A | Discharge status: Home / Self Care | Source: Ambulatory Visit | Attending: Hematology and Oncology | Admitting: Hematology and Oncology

## 2023-11-08 DIAGNOSIS — Z1501 Genetic susceptibility to malignant neoplasm of breast: Secondary | ICD-10-CM

## 2024-03-07 ENCOUNTER — Inpatient Hospital Stay: Payer: BC Managed Care – PPO | Admitting: Hematology and Oncology
